# Patient Record
Sex: Female | Born: 2000 | Race: Black or African American | Hispanic: No | Marital: Single | Smoking: Never smoker
Health system: Southern US, Community
[De-identification: ages and names within clinical notes are randomized; demographics above are authoritative.]

## PROBLEM LIST (undated history)

## (undated) DIAGNOSIS — J45909 Unspecified asthma, uncomplicated: Secondary | ICD-10-CM

## (undated) HISTORY — PX: OTHER SURGICAL HISTORY: SHX169

---
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
## 2021-03-01 ENCOUNTER — Encounter: Payer: Self-pay | Admitting: Certified Nurse Midwife

## 2021-03-01 ENCOUNTER — Other Ambulatory Visit: Payer: Self-pay

## 2021-03-01 ENCOUNTER — Ambulatory Visit (INDEPENDENT_AMBULATORY_CARE_PROVIDER_SITE_OTHER): Payer: Medicaid Other | Admitting: Certified Nurse Midwife

## 2021-03-01 VITALS — BP 118/71 | HR 74 | Ht 65.0 in | Wt 149.5 lb

## 2021-03-01 DIAGNOSIS — Z30017 Encounter for initial prescription of implantable subdermal contraceptive: Secondary | ICD-10-CM

## 2021-03-01 NOTE — Progress Notes (Signed)
OB/GYN CONFERENCE NOTE:  Subjective:       Teresa Mcmillan is a 20 y.o. G0P0000 female who presents for a conference appointment. Current complaints include: concerns about her nexplanon. PT state she had a friend that had nexplanon that had reported issuse with her period after cessation and with getting pregnant.   Questions about breast exam-when to start and how to do them   Gynecologic History No LMP recorded (lmp unknown). Patient has had an implant. Contraception: Nexplanon  Obstetric History OB History  Gravida Para Term Preterm AB Living  0 0 0 0 0 0  SAB IAB Ectopic Multiple Live Births  0 0 0 0 0    Past Medical History:  Diagnosis Date   Asthma     Past Surgical History:  Procedure Laterality Date   bands     Mom reports constrictive bands on her legs and finger when she was 18 months.   club feet      Current Outpatient Medications on File Prior to Visit  Medication Sig Dispense Refill   albuterol (PROVENTIL) (5 MG/ML) 0.5% nebulizer solution Take 2.5 mg by nebulization every 6 (six) hours as needed for Wheezing.     brompheniramine-pseudoephedrine-DM 30-2-10 MG/5ML syrup Take 5 mLs by mouth 4 (four) times daily as needed. 120 mL 0   cetirizine HCl (ZYRTEC) 5 MG/5ML SOLN Take by mouth.     diphenhydrAMINE (BENADRYL) 25 MG tablet Take 1 tablet (25 mg total) by mouth every 6 (six) hours. 20 tablet 0   EPINEPHrine (EPIPEN JR) 0.15 MG/0.3ML injection Inject 0.15 mg into the muscle as needed for anaphylaxis.     etonogestrel (NEXPLANON) 68 MG IMPL implant 1 each by Subdermal route once.     ketoconazole (NIZORAL) 2 % shampoo APPLY TO THE AFFECTED AREAS LATHER LEAVE IN PLACE FOR 5 MINUTES, ...  (REFER TO PRESCRIPTION NOTES).  1   Multiple Vitamins-Minerals (MULTIVITAMIN) tablet Take 1 tablet by mouth daily. 100 tablet 0   trimethoprim-polymyxin b (POLYTRIM) ophthalmic solution Place 2 drops into the left eye every 4 (four) hours. 10 mL 0   No current  facility-administered medications on file prior to visit.    Allergies  Allergen Reactions   Amoxicillin Hives   Penicillins    Shellfish Allergy     Social History   Socioeconomic History   Marital status: Single    Spouse name: Not on file   Number of children: Not on file   Years of education: Not on file   Highest education level: Not on file  Occupational History   Not on file  Tobacco Use   Smoking status: Never   Smokeless tobacco: Never  Vaping Use   Vaping Use: Some days  Substance and Sexual Activity   Alcohol use: No   Drug use: No   Sexual activity: Not Currently    Birth control/protection: Implant  Other Topics Concern   Not on file  Social History Narrative   Patient lives at home with mom and granmother. She is in the 9th grade at [employer]. She does well in school. She enjoys math, volleyball and swimming   Social Determinants of Corporate investment banker Strain: Not on file  Food Insecurity: Not on file  Transportation Needs: Not on file  Physical Activity: Not on file  Stress: Not on file  Social Connections: Not on file  Intimate Partner Violence: Not on file    Family History  Problem Relation Age of Onset  Breast cancer Maternal Aunt    Cancer Maternal Grandfather    Hypertension Maternal Grandfather    Diabetes Paternal Grandmother    Migraines Mother    ADD / ADHD Mother    Hypertension Father    Kidney disease Father    Seizures Neg Hx    Autism Neg Hx    Anxiety disorder Neg Hx    Depression Neg Hx    Bipolar disorder Neg Hx    Schizophrenia Neg Hx     The following portions of the patient's history were reviewed and updated as appropriate: allergies, current medications, past family history, past medical history, past social history, past surgical history and problem list.  Review of Systems Pertinent items are noted in HPI.   Objective:   BP 118/71   Pulse 74   Ht 5\' 5"  (1.651 m)   Wt 149 lb 8 oz (67.8 kg)    LMP  (LMP Unknown)   BMI 24.88 kg/m    Assessment/Plan:   Patient Active Problem List   Diagnosis Date Noted   Vaginal discharge 12/07/2020   Tension headache 08/07/2017   Migraine without aura and without status migrainosus, not intractable 08/07/2017   Secondary amenorrhea 07/31/2017   Unspecified asthma(493.90) 11/12/2012       Time: 10  Discussed mode of action , hormone in nexplanon, duration of use, and resuming natural cycle once nexplanon removed. Reassurance given. All questions answered.   Reviewed self breat exam , encourage pt to start now, information sheet given . Return to Clinic: For annual or prn.    11/14/2012, CNM [facility]

## 2021-03-01 NOTE — Patient Instructions (Addendum)
Etonogestrel Implant What is this medication? ETONOGESTREL (et oh noe JES trel) prevents ovulation and pregnancy. It belongs to a group of medications called contraceptives. This medication is a progestin hormone. This medicine may be used for other purposes; ask your health care provider or pharmacist if you have questions. COMMON BRAND NAME(S): Implanon, Nexplanon What should I tell my care team before I take this medication? They need to know if you have any of these conditions: Abnormal vaginal bleeding Blood vessel disease or blood clots Breast, cervical, endometrial, ovarian, liver, or uterine cancer Diabetes Gallbladder disease Heart disease or recent heart attack High blood pressure High cholesterol or triglycerides Kidney disease Liver disease Migraine headaches Seizures Stroke Tobacco smoker An unusual or allergic reaction to etonogestrel, anesthetics or antiseptics, other medications, foods, dyes, or preservatives Pregnant or trying to get pregnant Breast-feeding How should I use this medication? This device is inserted just under the skin on the inner side of your upper arm by your care team. Talk to your care team about the use of this medication in children. Special care may be needed. Overdosage: If you think you have taken too much of this medicine contact a poison control center or emergency room at once. NOTE: This medicine is only for you. Do not share this medicine with others. What if I miss a dose? This does not apply. What may interact with this medication? Do not take this medication with any of the following: Amprenavir Fosamprenavir This medication may also interact with the following: Acitretin Aprepitant Armodafinil Bexarotene Bosentan Carbamazepine Certain medications for fungal infections like fluconazole, ketoconazole, itraconazole and voriconazole Certain medications to treat hepatitis, HIV or  AIDS Cyclosporine Felbamate Griseofulvin Lamotrigine Modafinil Oxcarbazepine Phenobarbital Phenytoin Primidone Rifabutin Rifampin Rifapentine St. John's wort Topiramate This list may not describe all possible interactions. Give your health care provider a list of all the medicines, herbs, non-prescription drugs, or dietary supplements you use. Also tell them if you smoke, drink alcohol, or use illegal drugs. Some items may interact with your medicine. What should I watch for while using this medication? This product does not protect you against HIV infection (AIDS) or other sexually transmitted diseases. You should be able to feel the implant by pressing your fingertips over the skin where it was inserted. Contact your care team if you cannot feel the implant, and use a non-hormonal birth control method (such as condoms) until your care team confirms that the implant is in place. Contact your care team if you think that the implant may have broken or become bent while in your arm. You will receive a user card from your care team after the implant is inserted. The card is a record of the location of the implant in your upper arm and when it should be removed. Keep this card with your health records. What side effects may I notice from receiving this medication? Side effects that you should report to your care team as soon as possible: Allergic reactions-skin rash, itching, hives, swelling of the face, lips, tongue, or throat Blood clot-pain, swelling, or warmth in the leg, shortness of breath, chest pain Gallbladder problems-severe stomach pain, nausea, vomiting, fever Increase in blood pressure Liver injury-right upper belly pain, loss of appetite, nausea, light-colored stool, dark yellow or brown urine, yellowing skin or eyes, unusual weakness or fatigue New or worsening migraines or headaches Pain, redness, or irritation at injection site Stroke-sudden numbness or weakness of the face,  arm, or leg, trouble speaking, confusion, trouble walking, loss   of balance or coordination, dizziness, severe headache, change in vision Unusual vaginal discharge, itching, or odor Worsening mood, feelings of depression Side effects that usually do not require medical attention (report to your care team if they continue or are bothersome): Breast pain or tenderness Dark patches of skin on the face or other sun-exposed areas Irregular menstrual cycles or spotting Nausea Weight gain This list may not describe all possible side effects. Call your doctor for medical advice about side effects. You may report side effects to FDA at 1-800-FDA-1088. Where should I keep my medication? This medication is given in a hospital or clinic and will not be stored at home. NOTE: This sheet is a summary. It may not cover all possible information. If you have questions about this medicine, talk to your doctor, pharmacist, or health care provider.  2022 Elsevier/Gold Standard (2020-06-22 09:15:27)

## 2021-04-02 ENCOUNTER — Emergency Department (HOSPITAL_COMMUNITY)
Admission: EM | Admit: 2021-04-02 | Discharge: 2021-04-03 | Disposition: A | Discharge status: Home / Self Care | Payer: Medicaid Other | Attending: Emergency Medicine | Admitting: Emergency Medicine

## 2021-04-02 ENCOUNTER — Other Ambulatory Visit: Payer: Self-pay

## 2021-04-02 ENCOUNTER — Encounter (HOSPITAL_COMMUNITY): Payer: Self-pay | Admitting: Emergency Medicine

## 2021-04-02 DIAGNOSIS — R319 Hematuria, unspecified: Secondary | ICD-10-CM | POA: Insufficient documentation

## 2021-04-02 DIAGNOSIS — J45909 Unspecified asthma, uncomplicated: Secondary | ICD-10-CM | POA: Insufficient documentation

## 2021-04-02 DIAGNOSIS — R109 Unspecified abdominal pain: Secondary | ICD-10-CM | POA: Diagnosis not present

## 2021-04-02 DIAGNOSIS — R300 Dysuria: Secondary | ICD-10-CM | POA: Insufficient documentation

## 2021-04-02 LAB — URINALYSIS, ROUTINE W REFLEX MICROSCOPIC
Bacteria, UA: NONE SEEN
Bilirubin Urine: NEGATIVE
Glucose, UA: NEGATIVE mg/dL
Ketones, ur: NEGATIVE mg/dL
Leukocytes,Ua: NEGATIVE
Nitrite: NEGATIVE
Protein, ur: NEGATIVE mg/dL
Specific Gravity, Urine: 1.002 — ABNORMAL LOW (ref 1.005–1.030)
pH: 6 (ref 5.0–8.0)

## 2021-04-02 LAB — POC URINE PREG, ED: Preg Test, Ur: NEGATIVE

## 2021-04-02 NOTE — ED Triage Notes (Signed)
Pt c/o burning with urination and blood in her urine. Symptoms started this morning.

## 2021-04-02 NOTE — ED Provider Notes (Signed)
Emergency Medicine Provider Triage Evaluation Note  Teresa Mcmillan , a 20 y.o. female  was evaluated in triage.  Pt complains of dysuria.  Onset this morning.  Reports associated hematuria.  Denies new or unusual vaginal discharge or bleeding.  Denies fevers or vomiting.    Review of Systems  Positive: Dysuria, hematuria Negative: Fever, chills  Physical Exam  BP 120/74 (BP Location: Right Arm)   Pulse 92   Temp 98.3 F (36.8 C) (Oral)   Resp 18   SpO2 100%  Gen:   Awake, no distress   Resp:  Normal effort  MSK:   Moves extremities without difficulty  Other:    Medical Decision Making  Medically screening exam initiated at 10:39 PM.  Appropriate orders placed.  Teresa Mcmillan was informed that the remainder of the evaluation will be completed by another provider, this initial triage assessment does not replace that evaluation, and the importance of remaining in the ED until their evaluation is complete.  Dysuria   Roxy Horseman, PA-C 04/02/21 2241    Eber Hong, MD 04/03/21 2231

## 2021-04-03 MED ORDER — PHENAZOPYRIDINE HCL 200 MG PO TABS: 200.0000 mg | ORAL_TABLET | Freq: Three times a day (TID) | ORAL | 0 refills | Status: DC

## 2021-04-03 NOTE — ED Provider Notes (Signed)
[facility] EMERGENCY DEPARTMENT Provider Note   CSN: [id number] Arrival date & time: 04/02/21  2229     History Chief Complaint  Patient presents with   Hematuria    Teresa Mcmillan is a 20 y.o. female who presents to the emergency department for further evaluation of hematuria and dysuria that began yesterday.  Hematuria is intermittent and is noticeable when she wipes after urinating.  Has never had symptoms like this in the past.  She rates her dysuria mild severity.  She also reports associated abdominal pain.  She denies any vaginal symptoms, fever, chills, nausea, vomiting, diarrhea.  No urinary frequency or urgency.  Patient is not clear that her period and takes Nexplanon for birth control.   Hematuria      Past Medical History:  Diagnosis Date   Asthma     Patient Active Problem List   Diagnosis Date Noted   Vaginal discharge 12/07/2020   Tension headache 08/07/2017   Migraine without aura and without status migrainosus, not intractable 08/07/2017   Secondary amenorrhea 07/31/2017   Unspecified asthma(493.90) 11/12/2012    Past Surgical History:  Procedure Laterality Date   bands     Mom reports constrictive bands on her legs and finger when she was 18 months.   club feet       OB History     Gravida  0   Para  0   Term  0   Preterm  0   AB  0   Living  0      SAB  0   IAB  0   Ectopic  0   Multiple  0   Live Births  0           Family History  Problem Relation Age of Onset   Breast cancer Maternal Aunt    Cancer Maternal Grandfather    Hypertension Maternal Grandfather    Diabetes Paternal Grandmother    Migraines Mother    ADD / ADHD Mother    Hypertension Father    Kidney disease Father    Seizures Neg Hx    Autism Neg Hx    Anxiety disorder Neg Hx    Depression Neg Hx    Bipolar disorder Neg Hx    Schizophrenia Neg Hx     Social History   Tobacco Use   Smoking status: Never   Smokeless  tobacco: Never  Vaping Use   Vaping Use: Some days  Substance Use Topics   Alcohol use: No   Drug use: No    Home Medications Prior to Admission medications   Medication Sig Start Date End Date Taking? Authorizing Provider  phenazopyridine (PYRIDIUM) 200 MG tablet Take 1 tablet (200 mg total) by mouth 3 (three) times daily. 04/03/21  Yes My Madariaga M, PA-C  albuterol (PROVENTIL) (5 MG/ML) 0.5% nebulizer solution Take 2.5 mg by nebulization every 6 (six) hours as needed for Wheezing.    [provider]  brompheniramine-pseudoephedrine-DM 30-2-10 MG/5ML syrup Take 5 mLs by mouth 4 (four) times daily as needed. 10/23/20   Evon Slack, PA-C  cetirizine HCl (ZYRTEC) 5 MG/5ML SOLN Take by mouth.    [provider]  diphenhydrAMINE (BENADRYL) 25 MG tablet Take 1 tablet (25 mg total) by mouth every 6 (six) hours. 10/22/15   Hess, Nada Boozer, PA-C  EPINEPHrine (EPIPEN JR) 0.15 MG/0.3ML injection Inject 0.15 mg into the muscle as needed for anaphylaxis.    [provider]  etonogestrel (NEXPLANON) 68 MG IMPL implant 1 each by Subdermal route once.    [provider]  ketoconazole (NIZORAL) 2 % shampoo APPLY TO THE AFFECTED AREAS LATHER LEAVE IN PLACE FOR 5 MINUTES, ...  (REFER TO PRESCRIPTION NOTES). 07/30/17   [provider]  Multiple Vitamins-Minerals (MULTIVITAMIN) tablet Take 1 tablet by mouth daily. 08/07/19   Caren Macadam, MD  trimethoprim-polymyxin b (POLYTRIM) ophthalmic solution Place 2 drops into the left eye every 4 (four) hours. 12/07/19   Mabe, Forbes Cellar, MD    Allergies    Amoxicillin, Penicillins, and Shellfish allergy  Review of Systems   Review of Systems  Genitourinary:  Positive for hematuria.  All other systems reviewed and are negative.  Physical Exam Updated Vital Signs BP 106/69 (BP Location: Left Arm)   Pulse 68   Temp 98.5 F (36.9 C) (Oral)   Resp 18   SpO2 100%   Physical Exam Vitals and nursing note  reviewed.  Constitutional:      General: She is not in acute distress.    Appearance: Normal appearance.  HENT:     Head: Normocephalic and atraumatic.  Eyes:     General:        Right eye: No discharge.        Left eye: No discharge.  Cardiovascular:     Comments: Regular rate and rhythm.  S1/S2 are distinct without any evidence of murmur, rubs, or gallops.  Radial pulses are 2+ bilaterally.  Dorsalis pedis pulses are 2+ bilaterally.  No evidence of pedal edema. Pulmonary:     Comments: Clear to auscultation bilaterally.  Normal effort.  No respiratory distress.  No evidence of wheezes, rales, or rhonchi heard throughout. Abdominal:     General: Abdomen is flat. Bowel sounds are normal. There is no distension.     Tenderness: There is no abdominal tenderness. There is no guarding or rebound.     Comments: No CVAT bilaterally.  Musculoskeletal:        General: Normal range of motion.     Cervical back: Neck supple.  Skin:    General: Skin is warm and dry.     Findings: No rash.  Neurological:     General: No focal deficit present.     Mental Status: She is alert.  Psychiatric:        Mood and Affect: Mood normal.        Behavior: Behavior normal.    ED Results / Procedures / Treatments   Labs (all labs ordered are listed, but only abnormal results are displayed) Labs Reviewed  URINALYSIS, ROUTINE W REFLEX MICROSCOPIC - Abnormal; Notable for the following components:      Result Value   Color, Urine COLORLESS (*)    Specific Gravity, Urine 1.002 (*)    Hgb urine dipstick SMALL (*)    All other components within normal limits  URINE CULTURE  POC URINE PREG, ED    EKG None  Radiology No results found.  Procedures Procedures   Medications Ordered in ED Medications - No data to display  ED Course  I have reviewed the triage vital signs and the nursing notes.  Pertinent labs & imaging results that were available during my care of the patient were reviewed by me  and considered in my medical decision making (see chart for details).    MDM Rules/Calculators/A&P  Teresa Mcmillan is a 20 y.o. female who presents the emergency department for for evaluation of dysuria and hematuria.  History and physical exam is somewhat concerning for urinary tract infection.  I have a low suspicion for pyelonephritis, kidney stone, or other emergent causes of her symptoms at this time.  Low suspicion for TOA, ectopic pregnancy, or PID.  Her urine did not reveal any signs of infection today. Small amount of hematuria.  I will send for culture and treat dysuria with Azo.  Patient was amenable to this plan.  All questions and concerns addressed.  She is safe for discharge.   Final Clinical Impression(s) / ED Diagnoses Final diagnoses:  Dysuria    Rx / DC Orders ED Discharge Orders          Ordered    phenazopyridine (PYRIDIUM) 200 MG tablet  3 times daily        04/03/21 0814             Hendricks Limes, PA-C 04/03/21 0818    Davonna Belling, MD 04/03/21 [phone]

## 2021-04-03 NOTE — Discharge Instructions (Signed)
Your urinalysis did not reveal any signs of infection today.  Did show a little bit of blood.  I will send for urine culture which we will call you with the results.  We will give you Pyridium for burning sensation.  Please take for the next 2 days.  Please return to the emergency part if you experience worsening pain, fevers, increased blood in your urine, or any other concerns you might have.

## 2021-04-04 ENCOUNTER — Telehealth: Payer: Self-pay | Admitting: Obstetrics and Gynecology

## 2021-04-04 LAB — URINE CULTURE: Culture: 1000 — AB

## 2021-04-04 NOTE — Telephone Encounter (Signed)
Pt called stating that she has been having blood in her urine since Saturday morning and has a burning stinging pain when she urinates. Pt was see at ER where they told her no signs of infection and RX AZO. Pt is wanting to be seen for further explanation and evaluation. I provided soonest openings Pt was unable to come 11-08 due to school. Please Advice.

## 2021-04-05 ENCOUNTER — Encounter: Payer: Medicaid Other | Admitting: Certified Nurse Midwife

## 2021-04-05 ENCOUNTER — Telehealth: Payer: Self-pay

## 2021-04-05 ENCOUNTER — Telehealth (HOSPITAL_BASED_OUTPATIENT_CLINIC_OR_DEPARTMENT_OTHER): Payer: Self-pay | Admitting: Emergency Medicine

## 2021-04-05 NOTE — Telephone Encounter (Signed)
Post ED Visit - Positive Culture Follow-up  Culture report reviewed by antimicrobial stewardship pharmacist: [facility] []  Nathan Batchelder, Pharm.D. []  , Pharm.D., BCPS AQ-ID []  , Pharm.D., BCPS []  Celedonio Miyamoto, Pharm.D., BCPS []  [place], Garvin Fila.D., BCPS, AAHIVP []  , Pharm.D., BCPS, AAHIVP []  Georgina Pillion, PharmD, BCPS []  , PharmD, BCPS []  [place], PharmD, BCPS [x]  [address], PharmD []  , PharmD, BCPS []  Estella Husk, PharmD  Pharmacy Team []  Lysle Pearl, PharmD []  , PharmD []  Phillips Climes, PharmD []  , Rph []  Agapito Games) , PharmD []  Conni Elliot, PharmD []  , PharmD []  Mervyn Gay, PharmD []  , PharmD []  Vinnie Level, PharmD []  [facility], PharmD []  , PharmD []  Len Childs, PharmD   Positive urine culture No further patient follow-up is required at this time.  Katti Pelle 04/05/2021, 10:22 AM

## 2021-04-05 NOTE — Telephone Encounter (Signed)
Pt called in complaining of urinary symptoms and burning. Pt went to ED and had urine culture done but it did not show for enough bacteria for antibiotic to be sent in. Per pt., ED Rxed prescription Azo and patient still has 2 doses left. Patient & pts mother want to be seen asap and I explained to pt that she had an appt with [facility] today and canceled. Explained to pt and mother that we cannot send in medication if culture did not grow. Pt requested to schedule a future appt with front desk. All questions answered.

## 2021-04-05 NOTE — Telephone Encounter (Signed)
LM for pt to call back.

## 2021-04-25 ENCOUNTER — Encounter: Payer: Medicaid Other | Admitting: Certified Nurse Midwife

## 2021-04-28 ENCOUNTER — Encounter: Payer: Self-pay | Admitting: Certified Nurse Midwife

## 2021-05-03 ENCOUNTER — Encounter: Payer: Medicaid Other | Admitting: Certified Nurse Midwife

## 2021-08-03 IMAGING — CR DG CHEST 2V
2 series · 2 of 2 positions shown · non-contrast
Comparison: 07/15/2018

CLINICAL DATA: Mid chest pain all day

EXAM:
CHEST - 2 VIEW

[chest pa]
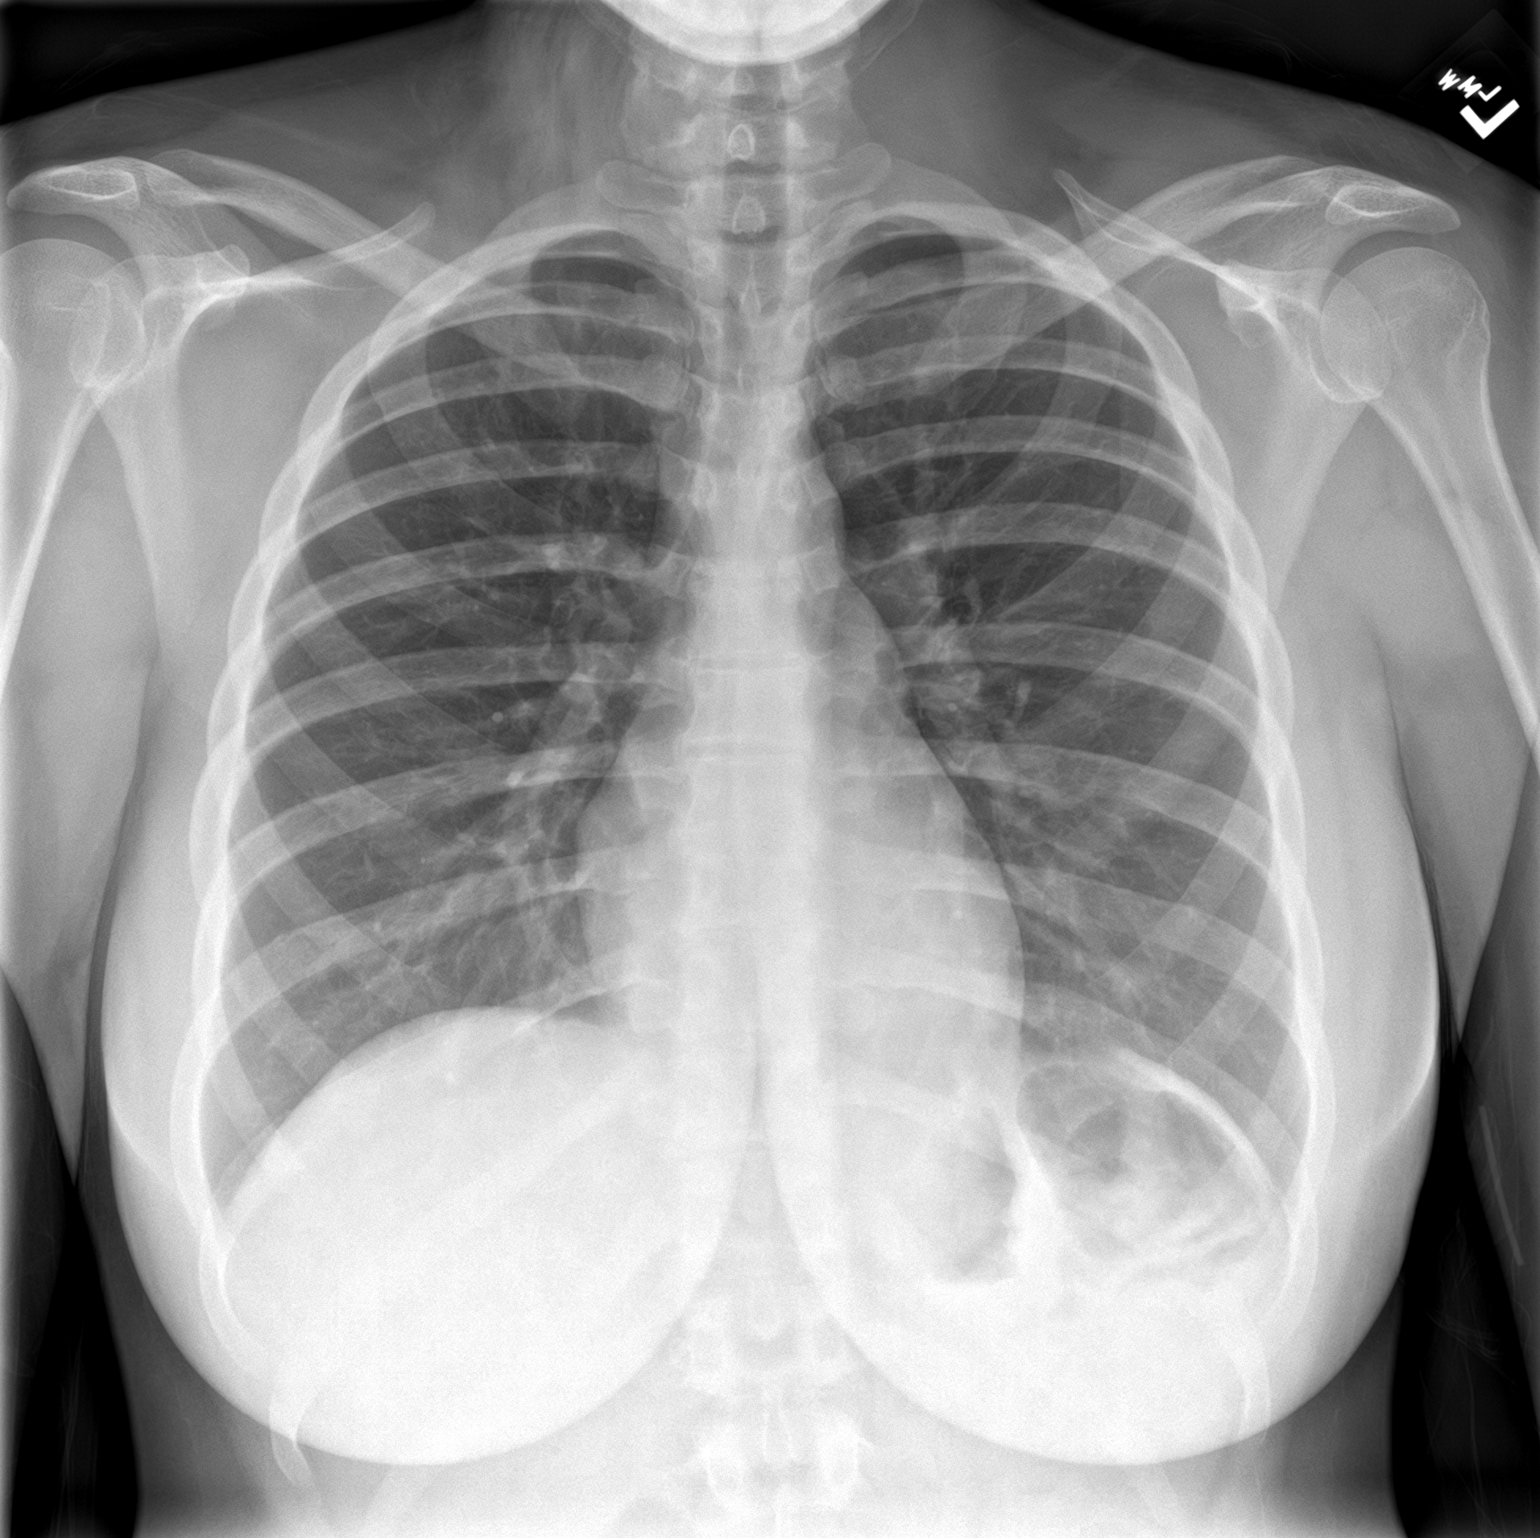

[chest lat]
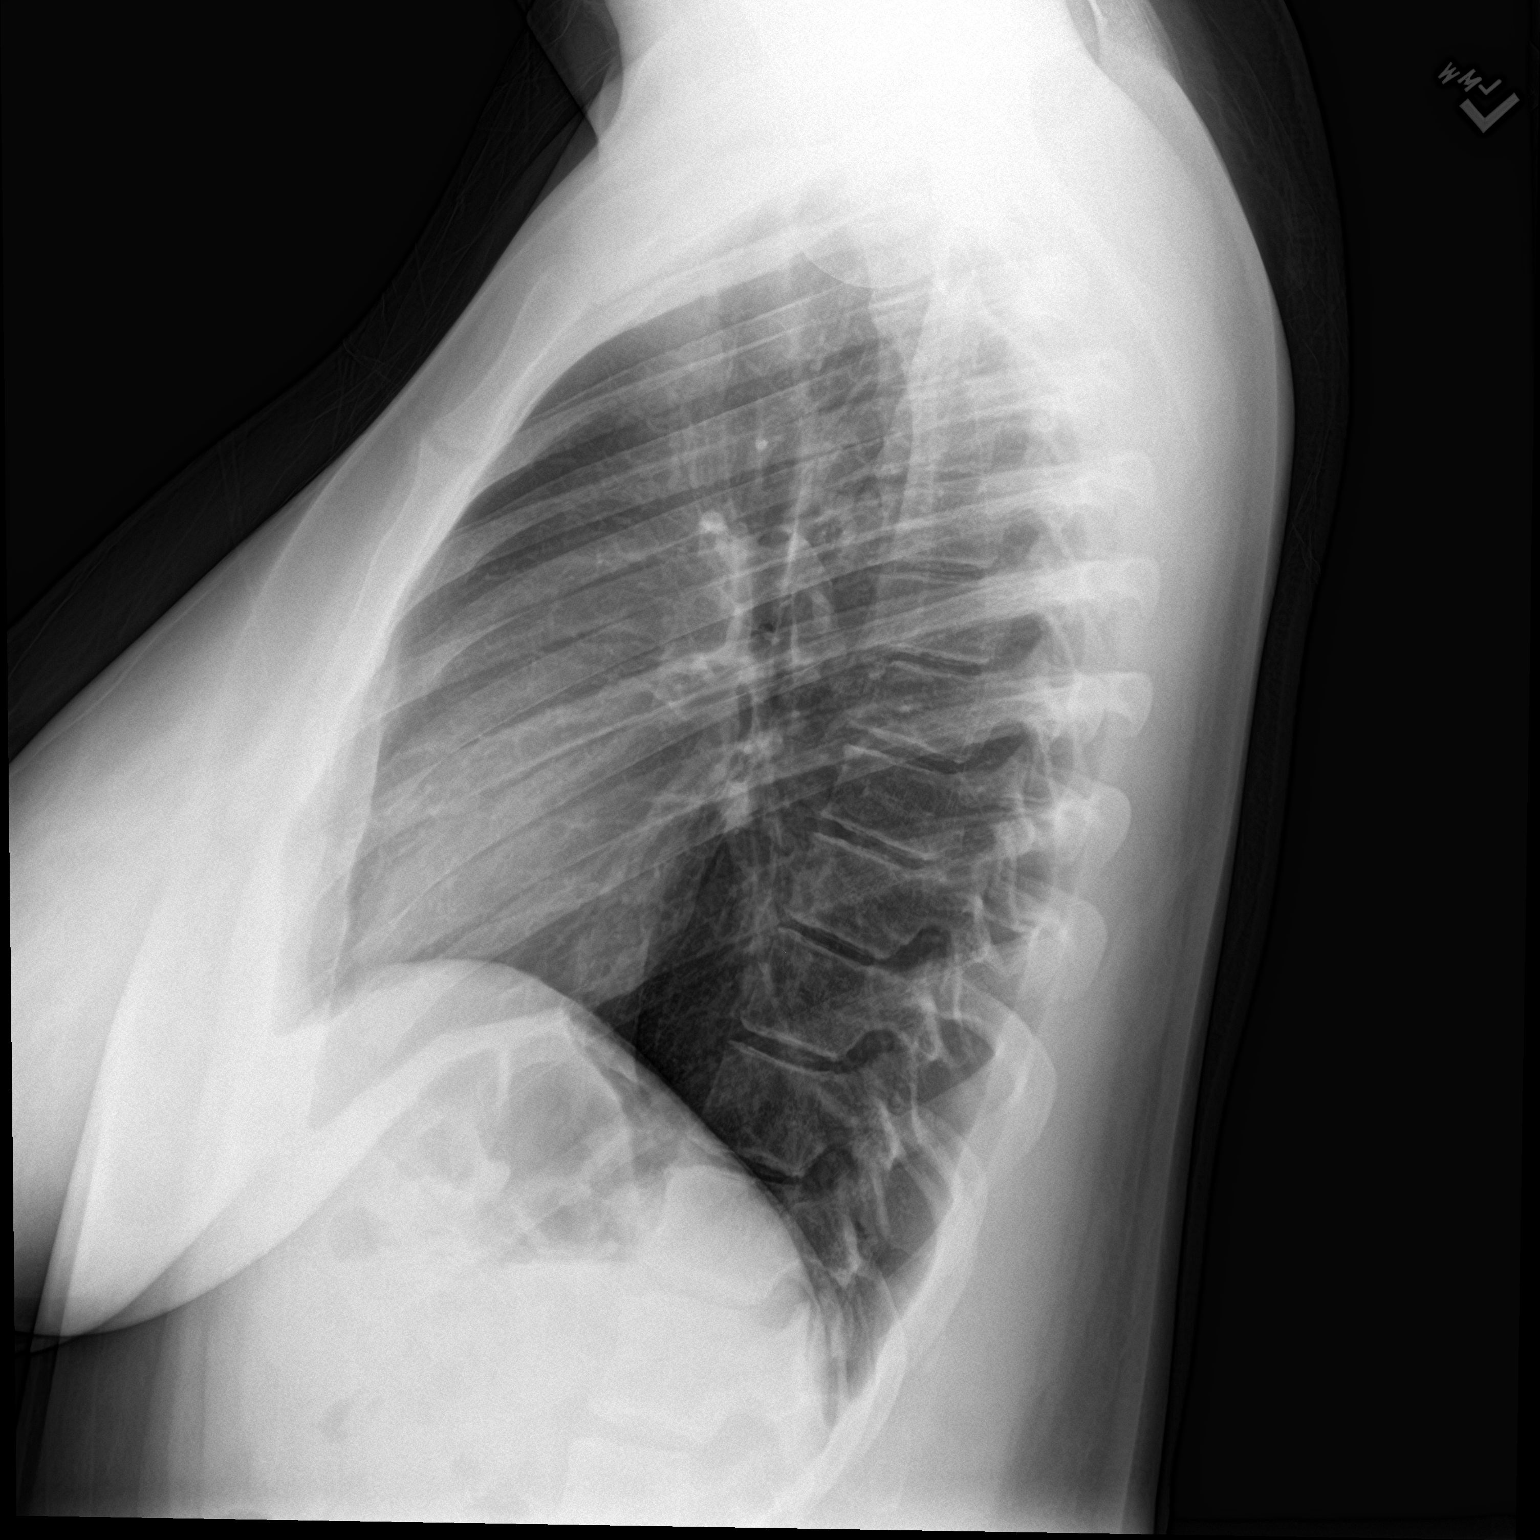

[2 of 2 positions shown; findings below may reference images not displayed]

FINDINGS: The heart size and mediastinal contours are within normal limits.
Both lungs are clear. The visualized skeletal structures are
unremarkable.
IMPRESSION: No active cardiopulmonary disease.

## 2021-09-01 ENCOUNTER — Encounter: Payer: Medicaid Other | Admitting: Obstetrics

## 2021-09-05 ENCOUNTER — Ambulatory Visit: Payer: Medicaid Other | Admitting: Certified Nurse Midwife

## 2021-09-05 ENCOUNTER — Ambulatory Visit (INDEPENDENT_AMBULATORY_CARE_PROVIDER_SITE_OTHER): Payer: Medicaid Other | Admitting: Certified Nurse Midwife

## 2021-09-05 VITALS — BP 121/72 | HR 93 | Ht 64.0 in | Wt 146.6 lb

## 2021-09-05 DIAGNOSIS — R300 Dysuria: Secondary | ICD-10-CM

## 2021-09-05 DIAGNOSIS — N9089 Other specified noninflammatory disorders of vulva and perineum: Secondary | ICD-10-CM

## 2021-09-05 DIAGNOSIS — R399 Unspecified symptoms and signs involving the genitourinary system: Secondary | ICD-10-CM

## 2021-09-05 LAB — POCT URINALYSIS DIPSTICK
Bilirubin, UA: NEGATIVE
Blood, UA: NEGATIVE
Glucose, UA: NEGATIVE
Ketones, UA: NEGATIVE
Leukocytes, UA: NEGATIVE
Nitrite, UA: NEGATIVE
Protein, UA: POSITIVE — AB
Spec Grav, UA: 1.015 (ref 1.010–1.025)
Urobilinogen, UA: 0.2 E.U./dL
pH, UA: 6 (ref 5.0–8.0)

## 2021-09-05 NOTE — Progress Notes (Signed)
GYN ENCOUNTER NOTE ? ?Subjective:  ?    ? Teresa Mcmillan is a 21 y.o. G0P0000 female is here for gynecologic evaluation of the following issues:  ?1. Pt state she has noticed a bump on the upper part of her mons that is not painful. She denies possible sti, she uses condoms and has not had oral intercourse.  ? ?2. Burning with voiding after shower.   ?Gynecologic History ?No LMP recorded (lmp unknown). Patient has had an implant. ?Contraception: condoms ?Last Pap: n/a. ?Last mammogram: n/a  ? ?Obstetric History ?OB History  ?Gravida Para Term Preterm AB Living  ?0 0 0 0 0 0  ?SAB IAB Ectopic Multiple Live Births  ?0 0 0 0 0  ? ? ?Past Medical History:  ?Diagnosis Date  ? Asthma   ? ? ?Past Surgical History:  ?Procedure Laterality Date  ? bands    ? Mom reports constrictive bands on her legs and finger when she was 18 months.  ? club feet    ? ? ?Current Outpatient Medications on File Prior to Visit  ?Medication Sig Dispense Refill  ? albuterol (PROVENTIL) (5 MG/ML) 0.5% nebulizer solution Take 2.5 mg by nebulization every 6 (six) hours as needed for Wheezing.    ? brompheniramine-pseudoephedrine-DM 30-2-10 MG/5ML syrup Take 5 mLs by mouth 4 (four) times daily as needed. 120 mL 0  ? cetirizine HCl (ZYRTEC) 5 MG/5ML SOLN Take by mouth.    ? diphenhydrAMINE (BENADRYL) 25 MG tablet Take 1 tablet (25 mg total) by mouth every 6 (six) hours. 20 tablet 0  ? EPINEPHrine (EPIPEN JR) 0.15 MG/0.3ML injection Inject 0.15 mg into the muscle as needed for anaphylaxis.    ? etonogestrel (NEXPLANON) 68 MG IMPL implant 1 each by Subdermal route once.    ? ketoconazole (NIZORAL) 2 % shampoo APPLY TO THE AFFECTED AREAS LATHER LEAVE IN PLACE FOR 5 MINUTES, ...  (REFER TO PRESCRIPTION NOTES).  1  ? Multiple Vitamins-Minerals (MULTIVITAMIN) tablet Take 1 tablet by mouth daily. 100 tablet 0  ? phenazopyridine (PYRIDIUM) 200 MG tablet Take 1 tablet (200 mg total) by mouth 3 (three) times daily. 6 tablet 0  ? trimethoprim-polymyxin b  (POLYTRIM) ophthalmic solution Place 2 drops into the left eye every 4 (four) hours. 10 mL 0  ? ?No current facility-administered medications on file prior to visit.  ? ? ?Allergies  ?Allergen Reactions  ? Amoxicillin Hives  ? Penicillins   ? Shellfish Allergy   ? ? ?Social History  ? ?Socioeconomic History  ? Marital status: Single  ?  Spouse name: Not on file  ? Number of children: Not on file  ? Years of education: Not on file  ? Highest education level: Not on file  ?Occupational History  ? Not on file  ?Tobacco Use  ? Smoking status: Never  ? Smokeless tobacco: Never  ?Vaping Use  ? Vaping Use: Some days  ?Substance and Sexual Activity  ? Alcohol use: No  ? Drug use: No  ? Sexual activity: Not Currently  ?  Birth control/protection: Implant  ?Other Topics Concern  ? Not on file  ?Social History Narrative  ? Patient lives at home with mom and granmother. She is in the 9th grade at [employer]. She does well in school. She enjoys math, volleyball and swimming  ? ?Social Determinants of Health  ? ?Financial Resource Strain: Not on file  ?Food Insecurity: Not on file  ?Transportation Needs: Not on file  ?Physical Activity: Not on  file  ?Stress: Not on file  ?Social Connections: Not on file  ?Intimate Partner Violence: Not on file  ? ? ?Family History  ?Problem Relation Age of Onset  ? Breast cancer Maternal Aunt   ? Cancer Maternal Grandfather   ? Hypertension Maternal Grandfather   ? Diabetes Paternal Grandmother   ? Migraines Mother   ? ADD / ADHD Mother   ? Hypertension Father   ? Kidney disease Father   ? Seizures Neg Hx   ? Autism Neg Hx   ? Anxiety disorder Neg Hx   ? Depression Neg Hx   ? Bipolar disorder Neg Hx   ? Schizophrenia Neg Hx   ? ? ?The following portions of the patient's history were reviewed and updated as appropriate: allergies, current medications, past family history, past medical history, past social history, past surgical history and problem list. ? ?Review of Systems ?Review of Systems -  Negative except as mentioned in HPI ?Review of Systems - General ROS: negative for - chills, fatigue, fever, hot flashes, malaise or night sweats ?Hematological and Lymphatic ROS: negative for - bleeding problems or swollen lymph nodes ?Gastrointestinal ROS: negative for - abdominal pain, blood in stools, change in bowel habits and nausea/vomiting ?Musculoskeletal ROS: negative for - joint pain, muscle pain or muscular weakness ?Genito-Urinary ROS: negative for - change in menstrual cycle, dysmenorrhea, dyspareunia, dysuria, genital discharge, genital ulcers, hematuria, incontinence, irregular/heavy menses, nocturia or pelvic pain. Bum on MONs.  ? ?Objective:  ? ?BP 121/72   Pulse 93   Ht 5\' 4"  (1.626 m)   Wt 146 lb 9.6 oz (66.5 kg)   LMP  (LMP Unknown)   BMI 25.16 kg/m?  ?CONSTITUTIONAL: Well-developed, well-nourished female in no acute distress.  ?HENT:  Normocephalic, atraumatic.  ?NECK: Normal range of motion, supple, no masses.  Normal thyroid.  ?SKIN: Skin is warm and dry. No rash noted. Not diaphoretic. No erythema. No pallor. ?NEUROLGIC: Alert and oriented to person, place, and time. PSYCHIATRIC: Normal mood and affect. Normal behavior. Normal judgment and thought content. ?CARDIOVASCULAR:Not Examined ?RESPIRATORY: Not Examined ?BREASTS: Not Examined ?ABDOMEN: Soft, non distended; Non tender.  No Organomegaly. ?PELVIC: ? External Genitalia: Normal, no lession seen . Pt points out her clitoris. It is not abnormal in appearance.  ? BUS: Normal ? Vagina: Normal ?MUSCULOSKELETAL: Normal range of motion. No tenderness.  No cyanosis, clubbing, or edema. ? ? ?Assessment:  ? ?Normal female exam   ? ? ?Plan:  ? ?Discussed with pt that this is her clitoris., she states that it felt bigger than it had before. Discussed hormonal changes and stimulation can cause it to encourage. Urine dip to evaluate for UTI. Discussed being less agressive with washing in shower and using gentle soap with out dye and perfume. She  verbalizes and agrees to plan. Follow up prn.  ? ? , CNM  ?

## 2021-09-08 ENCOUNTER — Telehealth: Payer: Self-pay | Admitting: Certified Nurse Midwife

## 2021-09-08 NOTE — Telephone Encounter (Signed)
Pt called and stated she would like a call for clarification on test results from 09/05/2021. Please advise.  ?

## 2021-09-08 NOTE — Telephone Encounter (Signed)
Pt is concerned over her labs. Pt states My Chart flagged a result. Please return her call.  ?

## 2021-09-09 NOTE — Telephone Encounter (Signed)
Spoke with pts mother in regards to 'flagged' results. Explained to patient's mother that urine results were WNL and that urinalysis was not indicative of UTI based on lack of leuks, nits, etc. Pts mother verbalized understanding of results.  ?

## 2021-11-14 ENCOUNTER — Telehealth: Payer: Self-pay | Admitting: Certified Nurse Midwife

## 2021-11-14 NOTE — Telephone Encounter (Signed)
PT called stating " she was experiencing Tender breast " Per protocol informed she can take 800 mg Ibuprofen every 8 hr  - wear a Supportive bra, Vitamin E 800iu for 7 days. If no improvement within 48 hrs call the office to schedule an appointment. Verbalized understanding

## 2021-11-14 NOTE — Telephone Encounter (Signed)
Made in error

## 2022-01-12 ENCOUNTER — Ambulatory Visit: Payer: Medicaid Other

## 2022-02-07 ENCOUNTER — Encounter: Payer: Medicaid Other | Admitting: Certified Nurse Midwife

## 2022-02-21 ENCOUNTER — Encounter: Payer: Self-pay | Admitting: Certified Nurse Midwife

## 2022-02-21 ENCOUNTER — Other Ambulatory Visit (HOSPITAL_COMMUNITY)
Admission: RE | Admit: 2022-02-21 | Discharge: 2022-02-21 | Disposition: A | Discharge status: Home / Self Care | Payer: Medicaid Other | Source: Ambulatory Visit | Attending: Certified Nurse Midwife | Admitting: Certified Nurse Midwife

## 2022-02-21 ENCOUNTER — Ambulatory Visit (INDEPENDENT_AMBULATORY_CARE_PROVIDER_SITE_OTHER): Payer: Medicaid Other | Admitting: Certified Nurse Midwife

## 2022-02-21 VITALS — BP 121/80 | HR 80 | Ht 65.0 in | Wt 151.5 lb

## 2022-02-21 DIAGNOSIS — N898 Other specified noninflammatory disorders of vagina: Secondary | ICD-10-CM | POA: Diagnosis not present

## 2022-02-21 NOTE — Progress Notes (Signed)
GYN ENCOUNTER NOTE  Subjective:       Teresa Mcmillan is a 21 y.o. G0P0000 female is here for gynecologic evaluation of the following issues:  1. Vaginal discharge 2. Breast tenderness .     Gynecologic History No LMP recorded. Patient has had an implant. Contraception: condoms Last Pap: n/a  Last mammogram: n/a .   Obstetric History OB History  Gravida Para Term Preterm AB Living  0 0 0 0 0 0  SAB IAB Ectopic Multiple Live Births  0 0 0 0 0    Past Medical History:  Diagnosis Date   Asthma     Past Surgical History:  Procedure Laterality Date   bands     Mom reports constrictive bands on her legs and finger when she was 18 months.   club feet      Current Outpatient Medications on File Prior to Visit  Medication Sig Dispense Refill   albuterol (PROVENTIL) (5 MG/ML) 0.5% nebulizer solution Take 2.5 mg by nebulization every 6 (six) hours as needed for Wheezing.     brompheniramine-pseudoephedrine-DM 30-2-10 MG/5ML syrup Take 5 mLs by mouth 4 (four) times daily as needed. 120 mL 0   cetirizine HCl (ZYRTEC) 5 MG/5ML SOLN Take by mouth.     diphenhydrAMINE (BENADRYL) 25 MG tablet Take 1 tablet (25 mg total) by mouth every 6 (six) hours. 20 tablet 0   EPINEPHrine (EPIPEN JR) 0.15 MG/0.3ML injection Inject 0.15 mg into the muscle as needed for anaphylaxis.     etonogestrel (NEXPLANON) 68 MG IMPL implant 1 each by Subdermal route once.     ketoconazole (NIZORAL) 2 % shampoo APPLY TO THE AFFECTED AREAS LATHER LEAVE IN PLACE FOR 5 MINUTES, ...  (REFER TO PRESCRIPTION NOTES).  1   Multiple Vitamins-Minerals (MULTIVITAMIN) tablet Take 1 tablet by mouth daily. 100 tablet 0   phenazopyridine (PYRIDIUM) 200 MG tablet Take 1 tablet (200 mg total) by mouth 3 (three) times daily. 6 tablet 0   trimethoprim-polymyxin b (POLYTRIM) ophthalmic solution Place 2 drops into the left eye every 4 (four) hours. 10 mL 0   No current facility-administered medications on file prior to visit.     Allergies  Allergen Reactions   Amoxicillin Hives   Penicillins    Shellfish Allergy     Social History   Socioeconomic History   Marital status: Single    Spouse name: Not on file   Number of children: Not on file   Years of education: Not on file   Highest education level: Not on file  Occupational History   Not on file  Tobacco Use   Smoking status: Never   Smokeless tobacco: Never  Vaping Use   Vaping Use: Some days  Substance and Sexual Activity   Alcohol use: No   Drug use: No   Sexual activity: Not Currently    Birth control/protection: Implant  Other Topics Concern   Not on file  Social History Narrative   Patient lives at home with mom and granmother. She is in the 9th grade at [employer]. She does well in school. She enjoys math, volleyball and swimming   Social Determinants of Corporate investment banker Strain: Not on file  Food Insecurity: Not on file  Transportation Needs: Not on file  Physical Activity: Not on file  Stress: Not on file  Social Connections: Not on file  Intimate Partner Violence: Not on file    Family History  Problem Relation Age of Onset  Breast cancer Maternal Aunt    Cancer Maternal Grandfather    Hypertension Maternal Grandfather    Diabetes Paternal 15    Migraines Mother    ADD / ADHD Mother    Hypertension Father    Kidney disease Father    Seizures Neg Hx    Autism Neg Hx    Anxiety disorder Neg Hx    Depression Neg Hx    Bipolar disorder Neg Hx    Schizophrenia Neg Hx     The following portions of the patient's history were reviewed and updated as appropriate: allergies, current medications, past family history, past medical history, past social history, past surgical history and problem list.  Review of Systems Review of Systems - Negative except as mentioned in HPI Review of Systems - General ROS: negative for - chills, fatigue, fever, hot flashes, malaise or night sweats Hematological and  Lymphatic ROS: negative for - bleeding problems or swollen lymph nodes Gastrointestinal ROS: negative for - abdominal pain, blood in stools, change in bowel habits and nausea/vomiting Musculoskeletal ROS: negative for - joint pain, muscle pain or muscular weakness Breast : right breast upper outer quadrant tender.  Genito-Urinary ROS: negative for - change in menstrual cycle, dysmenorrhea, dyspareunia, dysuria, genital discharge, genital ulcers, hematuria, incontinence, irregular/heavy menses, nocturia or pelvic pain   Objective:   BP 121/80   Pulse 80   Ht 5\' 5"  (1.651 m)   Wt 151 lb 8 oz (68.7 kg)   BMI 25.21 kg/m  CONSTITUTIONAL: Well-developed, well-nourished female in no acute distress.  HENT:  Normocephalic, atraumatic.  NECK: Normal range of motion, supple, no masses.  Normal thyroid.  SKIN: Skin is warm and dry. No rash noted. Not diaphoretic. No erythema. No pallor. [place]: Alert and oriented to person, place, and time. PSYCHIATRIC: Normal mood and affect. Normal behavior. Normal judgment and thought content. CARDIOVASCULAR:Not Examined RESPIRATORY: Not Examined BREASTS: Breasts: breasts appear normal, no suspicious masses, no skin or nipple changes or axillary nodes. Right breast fibrocystic dense tissue   ABDOMEN: Soft, non distended; Non tender.  No Organomegaly. PELVIC:  External Genitalia: Normal  BUS: Normal  Vagina: Normal  Cervix: Normal, slight brown/green color, swab collected  MUSCULOSKELETAL: Normal range of motion. No tenderness.  No cyanosis, clubbing, or edema.   Assessment:   Vaginal discharge  Breast pain   Plan:    Swab collected , will follow up with results. Discussed self help measures for breast pain due to fibrocystic breast tissue.  She verbalizes and agrees to plan of care.   Philip Aspen, CNM

## 2022-02-21 NOTE — Patient Instructions (Signed)

## 2022-02-23 LAB — CERVICOVAGINAL ANCILLARY ONLY
Bacterial Vaginitis (gardnerella): NEGATIVE
Candida Glabrata: NEGATIVE
Candida Vaginitis: NEGATIVE
Chlamydia: NEGATIVE
Comment: NEGATIVE
Comment: NEGATIVE
Comment: NEGATIVE
Comment: NEGATIVE
Comment: NEGATIVE
Comment: NORMAL
Neisseria Gonorrhea: NEGATIVE
Trichomonas: NEGATIVE

## 2022-04-17 ENCOUNTER — Ambulatory Visit (INDEPENDENT_AMBULATORY_CARE_PROVIDER_SITE_OTHER): Payer: Medicaid Other | Admitting: Certified Nurse Midwife

## 2022-04-17 ENCOUNTER — Encounter: Payer: Self-pay | Admitting: Certified Nurse Midwife

## 2022-04-17 VITALS — BP 105/70 | HR 88 | Wt 154.0 lb

## 2022-04-17 DIAGNOSIS — L689 Hypertrichosis, unspecified: Secondary | ICD-10-CM | POA: Diagnosis not present

## 2022-04-17 MED ORDER — IMIQUIMOD 3.75 % EX CREA: 1.0000 | TOPICAL_CREAM | Freq: Every day | CUTANEOUS | 0 refills | Status: DC

## 2022-04-17 NOTE — Progress Notes (Signed)
GYN ENCOUNTER NOTE  Subjective:       ROSSELLA Mcmillan is a 21 y.o. G0P0000 female is here for gynecologic evaluation of the following issues:  Hair growth on her chin Vaginal bumps .     Gynecologic History No LMP recorded (within years). Patient has had an implant. Contraception: Nexplanon Last Pap: n/a.  Last mammogram: n/a   Obstetric History OB History  Gravida Para Term Preterm AB Living  0 0 0 0 0 0  SAB IAB Ectopic Multiple Live Births  0 0 0 0 0    Past Medical History:  Diagnosis Date   Asthma     Past Surgical History:  Procedure Laterality Date   bands     Mom reports constrictive bands on her legs and finger when she was 18 months.   club feet      Current Outpatient Medications on File Prior to Visit  Medication Sig Dispense Refill   albuterol (PROVENTIL) (5 MG/ML) 0.5% nebulizer solution Take 2.5 mg by nebulization every 6 (six) hours as needed for Wheezing.     brompheniramine-pseudoephedrine-DM 30-2-10 MG/5ML syrup Take 5 mLs by mouth 4 (four) times daily as needed. 120 mL 0   cetirizine HCl (ZYRTEC) 5 MG/5ML SOLN Take by mouth.     diphenhydrAMINE (BENADRYL) 25 MG tablet Take 1 tablet (25 mg total) by mouth every 6 (six) hours. 20 tablet 0   EPINEPHrine (EPIPEN JR) 0.15 MG/0.3ML injection Inject 0.15 mg into the muscle as needed for anaphylaxis.     etonogestrel (NEXPLANON) 68 MG IMPL implant 1 each by Subdermal route once.     ketoconazole (NIZORAL) 2 % shampoo APPLY TO THE AFFECTED AREAS LATHER LEAVE IN PLACE FOR 5 MINUTES, ...  (REFER TO PRESCRIPTION NOTES).  1   Multiple Vitamins-Minerals (MULTIVITAMIN) tablet Take 1 tablet by mouth daily. 100 tablet 0   phenazopyridine (PYRIDIUM) 200 MG tablet Take 1 tablet (200 mg total) by mouth 3 (three) times daily. 6 tablet 0   trimethoprim-polymyxin b (POLYTRIM) ophthalmic solution Place 2 drops into the left eye every 4 (four) hours. (Patient not taking: Reported on 04/17/2022) 10 mL 0   No current  facility-administered medications on file prior to visit.    Allergies  Allergen Reactions   Amoxicillin Hives   Penicillins    Shellfish Allergy     Social History   Socioeconomic History   Marital status: Single    Spouse name: Not on file   Number of children: Not on file   Years of education: Not on file   Highest education level: Not on file  Occupational History   Not on file  Tobacco Use   Smoking status: Never   Smokeless tobacco: Never  Vaping Use   Vaping Use: Some days  Substance and Sexual Activity   Alcohol use: No   Drug use: No   Sexual activity: Not Currently    Birth control/protection: Implant  Other Topics Concern   Not on file  Social History Narrative   Patient lives at home with mom and granmother. She is in the 9th grade at [employer]. She does well in school. She enjoys math, volleyball and swimming   Social Determinants of Radio broadcast assistant Strain: Not on file  Food Insecurity: Not on file  Transportation Needs: Not on file  Physical Activity: Not on file  Stress: Not on file  Social Connections: Not on file  Intimate Partner Violence: Not on file    Family  History  Problem Relation Age of Onset   Breast cancer Maternal Aunt    Cancer Maternal Grandfather    Hypertension Maternal Grandfather    Diabetes Paternal Grandmother    Migraines Mother    ADD / ADHD Mother    Hypertension Father    Kidney disease Father    Seizures Neg Hx    Autism Neg Hx    Anxiety disorder Neg Hx    Depression Neg Hx    Bipolar disorder Neg Hx    Schizophrenia Neg Hx     The following portions of the patient's history were reviewed and updated as appropriate: allergies, current medications, past family history, past medical history, past social history, past surgical history and problem list.  Review of Systems Review of Systems - Negative except as mentioned in HPI Review of Systems - General ROS: negative for - chills, fatigue, fever,  hot flashes, malaise or night sweats Hematological and Lymphatic ROS: negative for - bleeding problems or swollen lymph nodes Gastrointestinal ROS: negative for - abdominal pain, blood in stools, change in bowel habits and nausea/vomiting Musculoskeletal ROS: negative for - joint pain, muscle pain or muscular weakness Genito-Urinary ROS: negative for - change in menstrual cycle, dysmenorrhea, dyspareunia, dysuria, genital discharge, genital ulcers, hematuria, incontinence, irregular/heavy menses, nocturia or pelvic pain. Vaginal lesion , Objective:   BP 105/70   Pulse 88   Wt 154 lb (69.9 kg)   LMP  (Within Years)   BMI 25.63 kg/m  CONSTITUTIONAL: Well-developed, well-nourished female in no acute distress.  HENT:  Normocephalic, atraumatic.  NECK: Normal range of motion, supple, no masses.  Normal thyroid.  SKIN: Skin is warm and dry. No rash noted. Not diaphoretic. No erythema. No pallor. NEUROLGIC: Alert and oriented to person, place, and time. PSYCHIATRIC: Normal mood and affect. Normal behavior. Normal judgment and thought content. CARDIOVASCULAR:Not Examined RESPIRATORY: Not Examined BREASTS: Not Examined ABDOMEN: Soft, non distended; Non tender.  No Organomegaly. PELVIC:  External Genitalia: Normal  BUS: Normal  Vagina: Normal, condylomata present at vaginal opening.    MUSCULOSKELETAL: Normal range of motion. No tenderness.  No cyanosis, clubbing, or edema.     Assessment:   Condylomata  Excess hair growth   Plan:   Discussed biopsy vs treatment of suspected HPV. Pt request to treat . Orders placed for imaquid. She declines vaginal swab stating she had done recently with PCP . Labs for testosterone and TSH ordered. Will follow up with results.

## 2022-04-21 LAB — THYROID PANEL WITH TSH
Free Thyroxine Index: 1.4 (ref 1.2–4.9)
T3 Uptake Ratio: 29 % (ref 24–39)
T4, Total: 4.7 ug/dL (ref 4.5–12.0)
TSH: 0.716 u[IU]/mL (ref 0.450–4.500)

## 2022-04-21 LAB — TESTOSTERONE, FREE, TOTAL, SHBG
Sex Hormone Binding: 51.5 nmol/L (ref 24.6–122.0)
Testosterone, Free: 1 pg/mL
Testosterone: 14 ng/dL (ref 13–71)

## 2022-04-24 ENCOUNTER — Encounter: Payer: Self-pay | Admitting: Certified Nurse Midwife

## 2022-04-24 ENCOUNTER — Other Ambulatory Visit: Payer: Self-pay | Admitting: Certified Nurse Midwife

## 2022-04-24 ENCOUNTER — Other Ambulatory Visit: Payer: Self-pay

## 2022-04-24 MED ORDER — IMIQUIMOD 2.5 % EX CREA: 1.0000 | TOPICAL_CREAM | Freq: Every evening | CUTANEOUS | 1 refills | Status: DC

## 2022-04-25 ENCOUNTER — Telehealth: Payer: Self-pay

## 2022-04-25 DIAGNOSIS — B079 Viral wart, unspecified: Secondary | ICD-10-CM

## 2022-04-25 MED ORDER — IMIQUIMOD 2.5 % EX CREA: 1.0000 | TOPICAL_CREAM | Freq: Every evening | CUTANEOUS | 1 refills | Status: DC

## 2022-04-25 NOTE — Telephone Encounter (Signed)
Pt calling states her cream was not at the pharmacy, I resent it. Told her to call and make sure. Call me back if it was not.

## 2022-04-26 ENCOUNTER — Telehealth: Payer: Self-pay

## 2022-04-26 NOTE — Telephone Encounter (Signed)
Patient has been advised. KW 

## 2022-04-26 NOTE — Telephone Encounter (Signed)
Patient contacted office to inquire about pre-authorization for prescription Imiquimod. I checked on status on pre-authorization that I initiated and it has been denied by patients insurance. Patient would like to know if another prescription can be sent to pharmacy? Please advise. KW

## 2022-04-27 NOTE — Telephone Encounter (Signed)
Pt mother calling, extremely rude and cussing. Saying her daughter cannot afford the medication that was sent in. Insurance is not covering it. Requesting another option. Good RX is still $330 and she is very upset about that. Advised her that I would send this to [facility] but she is going to be out staring tomorrow for 2 weeks. Can you please advise if there is something else she can do?

## 2022-04-28 ENCOUNTER — Encounter: Payer: Self-pay | Admitting: Licensed Practical Nurse

## 2022-04-28 ENCOUNTER — Other Ambulatory Visit: Payer: Self-pay | Admitting: Licensed Practical Nurse

## 2022-04-28 DIAGNOSIS — A630 Anogenital (venereal) warts: Secondary | ICD-10-CM

## 2022-04-28 MED ORDER — IMIQUIMOD 5 % EX CREA
TOPICAL_CREAM | CUTANEOUS | 0 refills | Status: DC
Start: 2022-04-28 — End: 2022-05-15

## 2022-04-28 NOTE — Progress Notes (Signed)
Pt called reporting imiquimod cream is too expensive.  Script for generic sent Jannifer Hick   [facility]  04/28/22  3:15 PM

## 2022-04-28 NOTE — Telephone Encounter (Signed)
Kaidyn mom called triage again this morning, was very rude stating that her daughter needs her medicine now, that they cannot pay the 330$, mom even stated can something else be called in that her insurance will cover.   [facility] is out of the office for 2 weeks. Can you please advise?

## 2022-05-15 ENCOUNTER — Telehealth: Payer: Self-pay

## 2022-05-15 ENCOUNTER — Other Ambulatory Visit: Payer: Self-pay

## 2022-05-15 ENCOUNTER — Encounter: Payer: Self-pay | Admitting: Licensed Practical Nurse

## 2022-05-15 DIAGNOSIS — A630 Anogenital (venereal) warts: Secondary | ICD-10-CM

## 2022-05-15 MED ORDER — IMIQUIMOD 5 % EX CREA: TOPICAL_CREAM | CUTANEOUS | 3 refills | Status: DC

## 2022-05-15 NOTE — Telephone Encounter (Signed)
Patient called requesting refills for the Aldara cream. She said she only got five tiny packets and that was not enough. I sent in more refills to last her for 6 months. She also had some questions on how to use the medication. I advised her not use internally (inside her vagina). Please advise.

## 2022-05-16 NOTE — Telephone Encounter (Signed)
Pt calling; there is insurance issues with her rx.  [phone]  Left msg for pt to call with more specifics as to what the issues are and the med.

## 2022-05-16 NOTE — Telephone Encounter (Signed)
Pt returned call; states she only receives five pkts at a time and that is not enough med to help her.  What to do?  [phone]  [facility] who states ins only allows 5 pkts at a time and she p/u rx at [facility] 12/1; called [facility] who said it's an ins issue - pt needs to call her ins; pt aware to call ins; adv generic was sent in yesterday.  Pt to let us know if we need to do anything.

## 2022-09-20 ENCOUNTER — Telehealth: Payer: Self-pay

## 2022-09-20 ENCOUNTER — Other Ambulatory Visit: Payer: Self-pay | Admitting: Licensed Practical Nurse

## 2022-09-20 DIAGNOSIS — A630 Anogenital (venereal) warts: Secondary | ICD-10-CM

## 2022-09-20 MED ORDER — IMIQUIMOD 5 % EX CREA: TOPICAL_CREAM | CUTANEOUS | 3 refills | Status: DC

## 2022-09-20 NOTE — Telephone Encounter (Signed)
Pt calling for a refill of the last medication that was rx'd for her. (Aldara)  [phone]

## 2022-09-20 NOTE — Telephone Encounter (Signed)
Pt aware.

## 2022-10-09 ENCOUNTER — Ambulatory Visit: Payer: Medicaid Other

## 2022-11-14 NOTE — Progress Notes (Deleted)
I,Tanylah Schnoebelen,acting as a Neurosurgeon for [employer], PA-C.,have documented all relevant documentation on the behalf of Alfredia Ferguson, PA-C,as directed by  Alfredia Ferguson, PA-C while in the presence of Alfredia Ferguson, PA-C.   New patient visit   Patient: Teresa Mcmillan   DOB: 12-Mar-2001   21 y.o. Female  MRN: [id number] Visit Date: 11/15/2022  Today's healthcare provider: Alfredia Ferguson, PA-C   No chief complaint on file.  Subjective    Teresa Mcmillan is a 22 y.o. female who presents today as a new patient to establish care.  HPI  ***  Past Medical History:  Diagnosis Date   Asthma    Past Surgical History:  Procedure Laterality Date   bands     Mom reports constrictive bands on her legs and finger when she was 18 months.   club feet     Family Status  Relation Name Status   Youth worker  (Not Specified)   MGF  Deceased   PGM  Alive   Mother  Alive   Father  Alive   MGM  Alive   PGF  Deceased   Neg Hx  (Not Specified)   Family History  Problem Relation Age of Onset   Breast cancer Maternal Aunt    Cancer Maternal Grandfather    Hypertension Maternal Grandfather    Diabetes Paternal Grandmother    Migraines Mother    ADD / ADHD Mother    Hypertension Father    Kidney disease Father    Seizures Neg Hx    Autism Neg Hx    Anxiety disorder Neg Hx    Depression Neg Hx    Bipolar disorder Neg Hx    Schizophrenia Neg Hx    Social History   Socioeconomic History   Marital status: Single    Spouse name: Not on file   Number of children: Not on file   Years of education: Not on file   Highest education level: Not on file  Occupational History   Not on file  Tobacco Use   Smoking status: Never   Smokeless tobacco: Never  Vaping Use   Vaping Use: Some days  Substance and Sexual Activity   Alcohol use: No   Drug use: No   Sexual activity: Not Currently    Birth control/protection: Implant  Other Topics Concern   Not on file  Social History Narrative    Patient lives at home with mom and granmother. She is in the 9th grade at [employer]. She does well in school. She enjoys math, volleyball and swimming   Social Determinants of Corporate investment banker Strain: Not on file  Food Insecurity: Not on file  Transportation Needs: Not on file  Physical Activity: Not on file  Stress: Not on file  Social Connections: Not on file   Outpatient Medications Prior to Visit  Medication Sig   albuterol (PROVENTIL) (5 MG/ML) 0.5% nebulizer solution Take 2.5 mg by nebulization every 6 (six) hours as needed for Wheezing.   brompheniramine-pseudoephedrine-DM 30-2-10 MG/5ML syrup Take 5 mLs by mouth 4 (four) times daily as needed.   cetirizine HCl (ZYRTEC) 5 MG/5ML SOLN Take by mouth.   diphenhydrAMINE (BENADRYL) 25 MG tablet Take 1 tablet (25 mg total) by mouth every 6 (six) hours.   EPINEPHrine (EPIPEN JR) 0.15 MG/0.3ML injection Inject 0.15 mg into the muscle as needed for anaphylaxis.   etonogestrel (NEXPLANON) 68 MG IMPL implant 1 each by Subdermal route once.  imiquimod (ALDARA) 5 % cream Apply topically 3 (three) times a week.   ketoconazole (NIZORAL) 2 % shampoo APPLY TO THE AFFECTED AREAS LATHER LEAVE IN PLACE FOR 5 MINUTES, ...  (REFER TO PRESCRIPTION NOTES).   Multiple Vitamins-Minerals (MULTIVITAMIN) tablet Take 1 tablet by mouth daily.   phenazopyridine (PYRIDIUM) 200 MG tablet Take 1 tablet (200 mg total) by mouth 3 (three) times daily.   trimethoprim-polymyxin b (POLYTRIM) ophthalmic solution Place 2 drops into the left eye every 4 (four) hours. (Patient not taking: Reported on 04/17/2022)   No facility-administered medications prior to visit.   Allergies  Allergen Reactions   Amoxicillin Hives   Penicillins    Shellfish Allergy     Immunization History  Administered Date(s) Administered   Influenza-Unspecified 07/02/2020   PPD Test 07/05/2020, 07/16/2020    Health Maintenance  Topic Date Due   COVID-19 Vaccine (1) Never  done   HPV VACCINES (1 - 2-dose series) Never done   HIV Screening  Never done   Hepatitis C Screening  Never done   DTaP/Tdap/Td (1 - Tdap) Never done   INFLUENZA VACCINE  12/28/2022   CHLAMYDIA SCREENING  02/22/2023    Patient Care Team: Pcp, No as PCP [facility]  Review of Systems  {Labs  Heme  Chem  Endocrine  Serology  Results Review (optional):23779}   Objective    There were no vitals taken for this visit. {Show previous Jarad Barth signs (optional):23777}  Physical Exam ***  Depression Screen     No data to display         No results found for any visits on 11/15/22.  Assessment & Plan     ***  No follow-ups on file.     {provider attestation***:1}   Alfredia Ferguson, PA-C  [facility] [phone] (phone) [phone] (fax)  [facility]

## 2022-11-15 ENCOUNTER — Ambulatory Visit: Payer: Medicaid Other | Admitting: Physician Assistant

## 2022-12-03 NOTE — Progress Notes (Unsigned)
Pcp, No   No chief complaint on file.   HPI:      Ms. Teresa Mcmillan is a 22 y.o. G0P0000 whose LMP was No LMP recorded. Patient has had an implant., presents today for *** On imiquimord since 4/24?   Patient Active Problem List   Diagnosis Date Noted   Vaginal discharge 12/07/2020   Tension headache 08/07/2017   Migraine without aura and without status migrainosus, not intractable 08/07/2017   Secondary amenorrhea 07/31/2017   Unspecified asthma(493.90) 11/12/2012    Past Surgical History:  Procedure Laterality Date   bands     Mom reports constrictive bands on her legs and finger when she was 18 months.   club feet      Family History  Problem Relation Age of Onset   Breast cancer Maternal Aunt    Cancer Maternal Grandfather    Hypertension Maternal Grandfather    Diabetes Paternal Grandmother    Migraines Mother    ADD / ADHD Mother    Hypertension Father    Kidney disease Father    Seizures Neg Hx    Autism Neg Hx    Anxiety disorder Neg Hx    Depression Neg Hx    Bipolar disorder Neg Hx    Schizophrenia Neg Hx     Social History   Socioeconomic History   Marital status: Single    Spouse name: Not on file   Number of children: Not on file   Years of education: Not on file   Highest education level: Not on file  Occupational History   Not on file  Tobacco Use   Smoking status: Never   Smokeless tobacco: Never  Vaping Use   Vaping Use: Some days  Substance and Sexual Activity   Alcohol use: No   Drug use: No   Sexual activity: Not Currently    Birth control/protection: Implant  Other Topics Concern   Not on file  Social History Narrative   Patient lives at home with mom and granmother. She is in the 9th grade at [employer]. She does well in school. She enjoys math, volleyball and swimming   Social Determinants of Corporate investment banker Strain: Not on file  Food Insecurity: Not on file  Transportation Needs: Not on file  Physical  Activity: Not on file  Stress: Not on file  Social Connections: Not on file  Intimate Partner Violence: Not on file    Outpatient Medications Prior to Visit  Medication Sig Dispense Refill   albuterol (PROVENTIL) (5 MG/ML) 0.5% nebulizer solution Take 2.5 mg by nebulization every 6 (six) hours as needed for Wheezing.     brompheniramine-pseudoephedrine-DM 30-2-10 MG/5ML syrup Take 5 mLs by mouth 4 (four) times daily as needed. 120 mL 0   cetirizine HCl (ZYRTEC) 5 MG/5ML SOLN Take by mouth.     diphenhydrAMINE (BENADRYL) 25 MG tablet Take 1 tablet (25 mg total) by mouth every 6 (six) hours. 20 tablet 0   EPINEPHrine (EPIPEN JR) 0.15 MG/0.3ML injection Inject 0.15 mg into the muscle as needed for anaphylaxis.     etonogestrel (NEXPLANON) 68 MG IMPL implant 1 each by Subdermal route once.     imiquimod (ALDARA) 5 % cream Apply topically 3 (three) times a week. 24 each 3   ketoconazole (NIZORAL) 2 % shampoo APPLY TO THE AFFECTED AREAS LATHER LEAVE IN PLACE FOR 5 MINUTES, ...  (REFER TO PRESCRIPTION NOTES).  1   Multiple Vitamins-Minerals (MULTIVITAMIN) tablet Take  1 tablet by mouth daily. 100 tablet 0   phenazopyridine (PYRIDIUM) 200 MG tablet Take 1 tablet (200 mg total) by mouth 3 (three) times daily. 6 tablet 0   trimethoprim-polymyxin b (POLYTRIM) ophthalmic solution Place 2 drops into the left eye every 4 (four) hours. (Patient not taking: Reported on 04/17/2022) 10 mL 0   No facility-administered medications prior to visit.      ROS:  Review of Systems BREAST: No symptoms   OBJECTIVE:   Vitals:  There were no vitals taken for this visit.  Physical Exam  Results: No results found for this or any previous visit (from the past 24 hour(s)).   Assessment/Plan: No diagnosis found.    No orders of the defined types were placed in this encounter.     No follow-ups on file.  Jalisa Sacco B. Jules Baty, PA-C 12/03/2022 4:56 PM

## 2022-12-04 ENCOUNTER — Ambulatory Visit (INDEPENDENT_AMBULATORY_CARE_PROVIDER_SITE_OTHER): Payer: Medicaid Other | Admitting: Obstetrics and Gynecology

## 2022-12-04 ENCOUNTER — Encounter: Payer: Self-pay | Admitting: Obstetrics and Gynecology

## 2022-12-04 VITALS — BP 106/74 | Ht 65.0 in | Wt 178.0 lb

## 2022-12-04 DIAGNOSIS — Z3202 Encounter for pregnancy test, result negative: Secondary | ICD-10-CM

## 2022-12-04 DIAGNOSIS — A630 Anogenital (venereal) warts: Secondary | ICD-10-CM | POA: Diagnosis not present

## 2022-12-04 DIAGNOSIS — Z309 Encounter for contraceptive management, unspecified: Secondary | ICD-10-CM

## 2022-12-04 LAB — POCT URINE PREGNANCY: Preg Test, Ur: NEGATIVE

## 2023-05-21 ENCOUNTER — Ambulatory Visit: Payer: Medicaid Other | Admitting: Nurse Practitioner

## 2023-05-21 NOTE — Progress Notes (Deleted)
   There were no vitals taken for this visit.   Subjective:    Patient ID: Teresa Mcmillan, female    DOB: 2000-07-20, 21 y.o.   MRN: [id number]  HPI: Teresa Mcmillan is a 22 y.o. female  No chief complaint on file.  Patient presents to clinic to establish care with new PCP.  Introduced to Publishing rights manager role and practice setting.  All questions answered.  Discussed provider/patient relationship and expectations.  Patient reports a history of ***. Patient denies a history of: Hypertension, Elevated Cholesterol, Diabetes, Thyroid problems, Depression, Anxiety, Neurological problems, and Abdominal problems.   Active Ambulatory Problems    Diagnosis Date Noted   Asthma 11/12/2012   Secondary amenorrhea 07/31/2017   Tension headache 08/07/2017   Migraine without aura and without status migrainosus, not intractable 08/07/2017   Vaginal discharge 12/07/2020   Resolved Ambulatory Problems    Diagnosis Date Noted   No Resolved Ambulatory Problems   No Additional Past Medical History   Past Surgical History:  Procedure Laterality Date   bands     Mom reports constrictive bands on her legs and finger when she was 18 months.   club feet     Family History  Problem Relation Age of Onset   Migraines Mother    ADD / ADHD Mother    Hypertension Father    Kidney disease Father    Breast cancer Maternal Aunt        unsure of age   Hypertension Maternal Grandfather    Lung cancer Maternal Grandfather    Diabetes Paternal Grandmother    Seizures Neg Hx    Autism Neg Hx    Anxiety disorder Neg Hx    Depression Neg Hx    Bipolar disorder Neg Hx    Schizophrenia Neg Hx      Review of Systems  Per HPI unless specifically indicated above     Objective:    There were no vitals taken for this visit.  Wt Readings from Last 3 Encounters:  12/04/22 178 lb (80.7 kg)  04/17/22 154 lb (69.9 kg) (83%, Z= 0.96)*  02/21/22 151 lb 8 oz (68.7 kg) (81%, Z= 0.89)*   * Growth percentiles  are based on CDC (Girls, 2-20 Years) data.    Physical Exam  Results for orders placed or performed in visit on 12/04/22  POCT urine pregnancy   Collection Time: 12/04/22  4:08 PM  Result Value Ref Range   Preg Test, Ur Negative Negative      Assessment & Plan:   Problem List Items Addressed This Visit   None    Follow up plan: No follow-ups on file.

## 2023-06-21 ENCOUNTER — Ambulatory Visit: Payer: Medicaid Other | Admitting: Cardiology

## 2023-06-25 ENCOUNTER — Ambulatory Visit: Payer: Medicaid Other | Admitting: Internal Medicine

## 2023-06-25 ENCOUNTER — Encounter: Payer: Self-pay | Admitting: Internal Medicine

## 2023-06-25 VITALS — BP 110/80 | HR 85 | Ht 65.0 in | Wt 187.0 lb

## 2023-06-25 DIAGNOSIS — R519 Headache, unspecified: Secondary | ICD-10-CM | POA: Insufficient documentation

## 2023-06-25 DIAGNOSIS — N946 Dysmenorrhea, unspecified: Secondary | ICD-10-CM | POA: Diagnosis not present

## 2023-06-25 DIAGNOSIS — R110 Nausea: Secondary | ICD-10-CM

## 2023-06-25 DIAGNOSIS — R7303 Prediabetes: Secondary | ICD-10-CM | POA: Insufficient documentation

## 2023-06-25 DIAGNOSIS — Z0130 Encounter for examination of blood pressure without abnormal findings: Secondary | ICD-10-CM

## 2023-06-25 MED ORDER — LORATADINE 10 MG PO CAPS: 1.0000 | ORAL_CAPSULE | Freq: Every day | ORAL | 3 refills | Status: AC

## 2023-06-25 MED ORDER — FLUTICASONE PROPIONATE 50 MCG/ACT NA SUSP
1.0000 | Freq: Every day | NASAL | 2 refills | Status: AC
Start: 2023-06-25 — End: ?

## 2023-06-25 NOTE — Progress Notes (Signed)
New Patient Office Visit  Subjective   Patient ID: Teresa Mcmillan, female    DOB: [birth year]  Age: 23 y.o. MRN: [id number]  CC:  Chief Complaint  Patient presents with   Establish Care    NPE    HPI BERNARDINA CACHO presents to establish care Previous Primary Care provider/office:   she does have additional concerns to discuss today.   Patient comes in to establish PMD, accompanied by her boyfriend.  Today she reports that she has history of recurrent sinus headaches.  She feels the pain behind her frontal sinuses along with nasal congestion.  She has not seen an ENT before for this.  She was prescribed NSAIDs for the headache.  She has never been diagnosed with migraine headaches.  Recently these headaches are becoming more frequent and they are associated with nausea which made her more concerned.  There is no tingling or numbness associated with these headaches.  And there is no photophobia or phonophobia.  Patient reports of  nasal congestion but there is no significant nasal discharge or postnasal drip.  She does not have a cough, no fevers and no chills. At her last eye exam she was told she needs to wear glasses, but she is not wearing them.  Patient advised to start correcting her vision as it can contribute to her headaches. She has never had a Pap smear before.  She used to have Nexplanon to help with menstrual cramps, but it was removed a month ago.  And now she is having severe cramps with her cycle.  Will set up a GYN referral for Pap and contraceptive management. Patient was told she has prediabetes, needs blood work today.    Outpatient Encounter Medications as of 06/25/2023  Medication Sig   albuterol (PROVENTIL) (5 MG/ML) 0.5% nebulizer solution Take 2.5 mg by nebulization every 6 (six) hours as needed for Wheezing.   fluticasone (FLONASE) 50 MCG/ACT nasal spray Place 1 spray into both nostrils daily.   Loratadine 10 MG CAPS Take 1 capsule (10 mg total) by mouth daily.    benzonatate (TESSALON) 200 MG capsule Take 200 mg by mouth 3 (three) times daily as needed. (Patient not taking: Reported on 06/25/2023)   etonogestrel (NEXPLANON) 68 MG IMPL implant 1 each by Subdermal route once. (Patient not taking: Reported on 06/25/2023)   imiquimod (ALDARA) 5 % cream Apply topically 3 (three) times a week. (Patient not taking: Reported on 06/25/2023)   mupirocin ointment (BACTROBAN) 2 % Apply topically 2 (two) times daily. (Patient not taking: Reported on 06/25/2023)   No facility-administered encounter medications on file as of 06/25/2023.    Past Medical History:  Diagnosis Date   Asthma     Past Surgical History:  Procedure Laterality Date   bands     Mom reports constrictive bands on her legs and finger when she was 18 months.   club feet      Family History  Problem Relation Age of Onset   Migraines Mother    ADD / ADHD Mother    Hypertension Father    Kidney disease Father    Breast cancer Maternal Aunt        unsure of age   Hypertension Maternal Grandfather    Lung cancer Maternal Grandfather    Diabetes Paternal Grandmother    Seizures Neg Hx    Autism Neg Hx    Anxiety disorder Neg Hx    Depression Neg Hx    Bipolar disorder Neg  Hx    Schizophrenia Neg Hx     Social History   Socioeconomic History   Marital status: Single    Spouse name: Not on file   Number of children: Not on file   Years of education: Not on file   Highest education level: Not on file  Occupational History   Not on file  Tobacco Use   Smoking status: Never   Smokeless tobacco: Never  Vaping Use   Vaping status: Some Days  Substance and Sexual Activity   Alcohol use: No   Drug use: No   Sexual activity: Yes    Birth control/protection: Implant, Condom  Other Topics Concern   Not on file  Social History Narrative   Patient lives at home with mom and granmother. She is in the 9th grade at [employer]. She does well in school. She enjoys math, volleyball and  swimming   Social Drivers of Health   Financial Resource Strain: Low Risk  (12/11/2021)   Received from [facility], [facility]   Overall Financial Resource Strain (CARDIA)    Difficulty of Paying Living Expenses: Not hard at all  Food Insecurity: No Food Insecurity (12/11/2021)   Received from [facility], [facility]   Hunger Vital Sign    Worried About Running Out of Food in the Last Year: Never true    Ran Out of Food in the Last Year: Never true  Transportation Needs: No Transportation Needs (12/11/2021)   Received from [facility], [facility]   [facility] - Transportation    In the past 12 months, has lack of transportation kept you from medical appointments or from getting medications?: No    Lack of Transportation (Non-Medical): No  Physical Activity: Not on file  Stress: Not on file  Social Connections: Not on file  Intimate Partner Violence: Not on file    Review of Systems  Constitutional: Negative.   HENT: Negative.    Eyes: Negative.   Respiratory: Negative.  Negative for cough and shortness of breath.   Cardiovascular: Negative.  Negative for chest pain, palpitations and leg swelling.  Gastrointestinal:  Positive for nausea. Negative for abdominal pain, constipation, diarrhea, heartburn and vomiting.  Genitourinary: Negative.  Negative for dysuria and flank pain.  Musculoskeletal: Negative.  Negative for joint pain and myalgias.  Skin: Negative.   Neurological:  Positive for headaches. Negative for dizziness.  Endo/Heme/Allergies: Negative.   Psychiatric/Behavioral: Negative.  Negative for depression and suicidal ideas. The patient is not nervous/anxious.         Objective   BP 110/80   Pulse 85   Ht 5\' 5"  (1.651 m)   Wt 187 lb (84.8 kg)   SpO2 98%   BMI 31.12 kg/m   Physical Exam Vitals and nursing note reviewed.  Constitutional:       Appearance: Normal appearance.  HENT:     Head: Normocephalic and atraumatic.     Nose: Nose normal.     Mouth/Throat:     Mouth: Mucous membranes are moist.     Pharynx: Oropharynx is clear.  Eyes:     Conjunctiva/sclera: Conjunctivae normal.     Pupils: Pupils are equal, round, and reactive to light.  Cardiovascular:     Rate and Rhythm: Normal rate and regular rhythm.     Pulses: Normal pulses.     Heart sounds: Normal heart sounds. No murmur heard. Pulmonary:  Effort: Pulmonary effort is normal.     Breath sounds: Normal breath sounds. No wheezing.  Abdominal:     General: Bowel sounds are normal.     Palpations: Abdomen is soft.     Tenderness: There is no abdominal tenderness. There is no right CVA tenderness or left CVA tenderness.  Musculoskeletal:        General: Normal range of motion.     Cervical back: Normal range of motion.     Right lower leg: No edema.     Left lower leg: No edema.  Skin:    General: Skin is warm and dry.  Neurological:     General: No focal deficit present.     Mental Status: She is alert and oriented to person, place, and time.  Psychiatric:        Mood and Affect: Mood normal.        Behavior: Behavior normal.        Assessment & Plan:  Schedule CT of the paranasal sinuses.  Start Flonase and Claritin.. Blood work today. GYN referral. Problem List Items Addressed This Visit   None Visit Diagnoses       Prediabetes    -  Primary   Relevant Orders   CMP14+EGFR   Hemoglobin A1c     Nausea       Relevant Orders   CBC with Diff     Sinus headache       Relevant Medications   fluticasone (FLONASE) 50 MCG/ACT nasal spray   Loratadine 10 MG CAPS   Other Relevant Orders   Sed Rate (ESR)   CT MAXILLOFACIAL WO CONTRAST     Severe menstrual cramps       Relevant Orders   Ambulatory referral to Obstetrics / Gynecology       Return in about 10 days (around 07/05/2023).   Total time spent: 30 minutes  Margaretann Loveless,  MD  06/25/2023   This document may have been prepared by [facility] Voice Recognition software and as such may include unintentional dictation errors.

## 2023-06-26 LAB — CBC WITH DIFFERENTIAL/PLATELET
Basophils Absolute: 0 10*3/uL (ref 0.0–0.2)
Basos: 1 %
EOS (ABSOLUTE): 0.1 10*3/uL (ref 0.0–0.4)
Eos: 2 %
Hematocrit: 41.3 % (ref 34.0–46.6)
Hemoglobin: 12.8 g/dL (ref 11.1–15.9)
Immature Grans (Abs): 0 10*3/uL (ref 0.0–0.1)
Immature Granulocytes: 0 %
Lymphocytes Absolute: 2 10*3/uL (ref 0.7–3.1)
Lymphs: 51 %
MCH: 27.5 pg (ref 26.6–33.0)
MCHC: 31 g/dL — ABNORMAL LOW (ref 31.5–35.7)
MCV: 89 fL (ref 79–97)
Monocytes Absolute: 0.3 10*3/uL (ref 0.1–0.9)
Monocytes: 7 %
Neutrophils Absolute: 1.5 10*3/uL (ref 1.4–7.0)
Neutrophils: 39 %
Platelets: 241 10*3/uL (ref 150–450)
RBC: 4.66 x10E6/uL (ref 3.77–5.28)
RDW: 13.3 % (ref 11.7–15.4)
WBC: 3.8 10*3/uL (ref 3.4–10.8)

## 2023-06-26 LAB — CMP14+EGFR
ALT: 14 [IU]/L (ref 0–32)
AST: 19 [IU]/L (ref 0–40)
Albumin: 4.2 g/dL (ref 4.0–5.0)
Alkaline Phosphatase: 61 [IU]/L (ref 44–121)
BUN/Creatinine Ratio: 20 (ref 9–23)
BUN: 20 mg/dL (ref 6–20)
Bilirubin Total: <0.2 mg/dL (ref 0.0–1.2)
CO2: 25 mmol/L (ref 20–29)
Calcium: 9.6 mg/dL (ref 8.7–10.2)
Chloride: 100 mmol/L (ref 96–106)
Creatinine, Ser: 1 mg/dL (ref 0.57–1.00)
Globulin, Total: 3.1 g/dL (ref 1.5–4.5)
Glucose: 69 mg/dL — ABNORMAL LOW (ref 70–99)
Potassium: 4.7 mmol/L (ref 3.5–5.2)
Sodium: 139 mmol/L (ref 134–144)
Total Protein: 7.3 g/dL (ref 6.0–8.5)
eGFR: 82 mL/min/{1.73_m2} (ref >59)

## 2023-06-26 LAB — HEMOGLOBIN A1C
Est. average glucose Bld gHb Est-mCnc: 120 mg/dL
Hgb A1c MFr Bld: 5.8 % — ABNORMAL HIGH (ref 4.8–5.6)

## 2023-06-26 LAB — SEDIMENTATION RATE: Sed Rate: 23 mm/h (ref 0–32)

## 2023-07-06 ENCOUNTER — Ambulatory Visit: Payer: Medicaid Other | Admitting: Internal Medicine

## 2023-07-17 ENCOUNTER — Other Ambulatory Visit: Payer: Medicaid Other

## 2023-07-31 ENCOUNTER — Ambulatory Visit: Payer: Medicaid Other

## 2023-07-31 DIAGNOSIS — R519 Headache, unspecified: Secondary | ICD-10-CM | POA: Diagnosis not present

## 2023-08-01 ENCOUNTER — Ambulatory Visit: Payer: Medicaid Other | Admitting: Certified Nurse Midwife

## 2023-08-01 VITALS — BP 107/72 | HR 84 | Ht 65.0 in | Wt 183.3 lb

## 2023-08-01 DIAGNOSIS — N946 Dysmenorrhea, unspecified: Secondary | ICD-10-CM

## 2023-08-01 DIAGNOSIS — N920 Excessive and frequent menstruation with regular cycle: Secondary | ICD-10-CM

## 2023-08-01 MED ORDER — ALBUTEROL SULFATE HFA 108 (90 BASE) MCG/ACT IN AERS: 2.0000 | INHALATION_SPRAY | Freq: Four times a day (QID) | RESPIRATORY_TRACT | 2 refills | Status: AC | PRN

## 2023-08-01 MED ORDER — IBUPROFEN 800 MG PO TABS
800.0000 mg | ORAL_TABLET | Freq: Three times a day (TID) | ORAL | 1 refills | Status: AC | PRN
Start: 2023-08-01 — End: ?

## 2023-08-01 NOTE — Progress Notes (Signed)
 Margaretann Loveless, MD   No chief complaint on file.   HPI:      Teresa Mcmillan is a 23 y.o. G0P0000 whose LMP was Patient's last menstrual period was 07/20/2023 (exact date)., presents today for painful periods. She has used Depo & Nexplanon in the past to manage her cycles. She would prefer not to use a hormonal method at this time. 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     Patient Active Problem List   Diagnosis Date Noted   Prediabetes 06/25/2023   Severe menstrual cramps 06/25/2023   Sinus headache 06/25/2023   Vaginal discharge 12/07/2020   Tension headache 08/07/2017   Migraine without aura and without status migrainosus, not intractable 08/07/2017   Secondary amenorrhea 07/31/2017   Asthma 11/12/2012    Past Surgical History:  Procedure Laterality Date   bands     Mom reports constrictive bands on her legs and finger when she was 18 months.   club feet      Family History  Problem Relation Age of Onset   Migraines Mother    ADD / ADHD Mother    Hypertension Father    Kidney disease Father    Breast cancer Maternal Aunt        unsure of age   Hypertension Maternal Grandfather    Lung cancer Maternal Grandfather    Diabetes Paternal Grandmother    Seizures Neg Hx    Autism Neg Hx    Anxiety disorder Neg Hx    Depression Neg Hx    Bipolar disorder Neg Hx    Schizophrenia Neg Hx     Social History   Socioeconomic History   Marital status: Single    Spouse name: Not on file   Number of children: Not on file   Years of education: Not on file   Highest education level: Not on file  Occupational History   Not on file  Tobacco Use   Smoking status: Never   Smokeless tobacco: Never  Vaping Use   Vaping status: Some Days  Substance and Sexual Activity   Alcohol use: No   Drug use: No   Sexual activity: Yes    Birth control/protection: Implant, Condom  Other Topics Concern   Not on file  Social History Narrative    Patient lives at home with mom and granmother. She is in the 9th grade at [employer]. She does well in school. She enjoys math, volleyball and swimming   Social Drivers of Health   Financial Resource Strain: Low Risk  (12/11/2021)   Received from [facility], [facility]   Overall Financial Resource Strain (CARDIA)    Difficulty of Paying Living Expenses: Not hard at all  Food Insecurity: No Food Insecurity (12/11/2021)   Received from [facility], [facility]   Hunger Vital Sign    Worried About Running Out of Food in the Last Year: Never true    Ran Out of Food in the Last Year: Never true  Transportation Needs: No Transportation Needs (12/11/2021)   Received from [facility], [facility]   [facility] - Transportation    In the past 12 months, has lack of transportation kept you from medical appointments or from getting medications?: No    Lack of Transportation (Non-Medical): No  Physical Activity: Not on file  Stress: Not on file  Social Connections: Not on  file  Intimate Partner Violence: Not on file    Outpatient Medications Prior to Visit  Medication Sig Dispense Refill   Loratadine 10 MG CAPS Take 1 capsule (10 mg total) by mouth daily. 30 capsule 3   benzonatate (TESSALON) 200 MG capsule Take 200 mg by mouth 3 (three) times daily as needed.     etonogestrel (NEXPLANON) 68 MG IMPL implant 1 each by Subdermal route once. (Patient not taking: Reported on 06/25/2023)     fluticasone (FLONASE) 50 MCG/ACT nasal spray Place 1 spray into both nostrils daily. (Patient not taking: Reported on 08/01/2023) 11.1 mL 2   imiquimod (ALDARA) 5 % cream Apply topically 3 (three) times a week. (Patient not taking: Reported on 08/01/2023) 24 each 3   mupirocin ointment (BACTROBAN) 2 % Apply topically 2 (two) times daily. (Patient not taking: Reported on 08/01/2023)     albuterol (PROVENTIL) (5 MG/ML)  0.5% nebulizer solution Take 2.5 mg by nebulization every 6 (six) hours as needed for Wheezing. (Patient not taking: Reported on 08/01/2023)     No facility-administered medications prior to visit.      ROS:  Review of Systems  Constitutional: Negative.   Respiratory: Negative.    Cardiovascular: Negative.   Genitourinary:  Positive for menstrual problem.     OBJECTIVE:   Vitals:  BP 107/72   Pulse 84   Ht 5\' 5"  (1.651 m)   Wt 183 lb 4.8 oz (83.1 kg)   LMP 07/20/2023 (Exact Date)   BMI 30.50 kg/m   Physical Exam Constitutional:      Appearance: Normal appearance.  Cardiovascular:     Rate and Rhythm: Normal rate.  Pulmonary:     Effort: Pulmonary effort is normal.  Neurological:     General: No focal deficit present.     Mental Status: She is alert and oriented to person, place, and time.  Psychiatric:        Mood and Affect: Mood normal.        Behavior: Behavior normal.     Results: No results found for this or any previous visit (from the past 24 hours).   Assessment/Plan: Dysmenorrhea - Plan: ibuprofen (ADVIL) 800 MG tablet  Menorrhagia with regular cycle - Plan: ibuprofen (ADVIL) 800 MG tablet   Trial ibuprofen prior to cycle and continuing through first two days of bleeding. Will schedule annual exam for pap & exam of possible HPV related skin changes. Meds ordered this encounter  Medications   ibuprofen (ADVIL) 800 MG tablet    Sig: Take 1 tablet (800 mg total) by mouth every 8 (eight) hours as needed for cramping. Take 1 tablet every hours beginning 1 day prior to menstrual cycle and continuing for first two days of bleeding.    Dispense:  60 tablet    Refill:  1   albuterol (VENTOLIN HFA) 108 (90 Base) MCG/ACT inhaler    Sig: Inhale 2 puffs into the lungs every 6 (six) hours as needed for wheezing or shortness of breath.    Dispense:  8 g    Refill:  2     Dominica Severin, CNM

## 2023-08-03 ENCOUNTER — Ambulatory Visit: Payer: Medicaid Other | Admitting: Internal Medicine

## 2023-08-03 ENCOUNTER — Encounter: Payer: Self-pay | Admitting: Internal Medicine

## 2023-08-03 VITALS — BP 102/68 | HR 103 | Ht 65.0 in | Wt 185.0 lb

## 2023-08-03 DIAGNOSIS — R519 Headache, unspecified: Secondary | ICD-10-CM | POA: Diagnosis not present

## 2023-08-03 DIAGNOSIS — R7303 Prediabetes: Secondary | ICD-10-CM | POA: Diagnosis not present

## 2023-08-03 DIAGNOSIS — N946 Dysmenorrhea, unspecified: Secondary | ICD-10-CM

## 2023-08-03 DIAGNOSIS — Z0130 Encounter for examination of blood pressure without abnormal findings: Secondary | ICD-10-CM

## 2023-08-03 NOTE — Progress Notes (Signed)
 Established Patient Office Visit  Subjective:  Patient ID: Teresa Mcmillan, female    DOB: 05/12/2001  Age: 23 y.o. MRN: [id number]  Chief Complaint  Patient presents with   Follow-up    10 day follow up, CT results     Patient comes in for her follow-up today.  She reports that her headaches have gone away almost completely and she is not using her Flonase nasal spray or her Claritin.  She had her CT scan of the sinuses done but the report is not available.   She has also not picked up her eyeglasses yet. She was seen by the OB/GYN and advised to use ibuprofen for menstrual cramps.  She will return in 1 month for a Pap smear and to discuss contraceptive measures. Lab results discussed today, needs to avoid concentrated sweets as her hemoglobin A1c is 5.8.    No other concerns at this time.   Past Medical History:  Diagnosis Date   Asthma     Past Surgical History:  Procedure Laterality Date   bands     Mom reports constrictive bands on her legs and finger when she was 18 months.   club feet      Social History   Socioeconomic History   Marital status: Single    Spouse name: Not on file   Number of children: Not on file   Years of education: Not on file   Highest education level: Not on file  Occupational History   Not on file  Tobacco Use   Smoking status: Never   Smokeless tobacco: Never  Vaping Use   Vaping status: Some Days  Substance and Sexual Activity   Alcohol use: No   Drug use: No   Sexual activity: Yes    Birth control/protection: Implant, Condom  Other Topics Concern   Not on file  Social History Narrative   Patient lives at home with mom and granmother. She is in the 9th grade at [employer]. She does well in school. She enjoys math, volleyball and swimming   Social Drivers of Health   Financial Resource Strain: Low Risk  (12/11/2021)   Received from [facility], [facility]   Overall Financial Resource  Strain (CARDIA)    Difficulty of Paying Living Expenses: Not hard at all  Food Insecurity: No Food Insecurity (12/11/2021)   Received from [facility], [facility]   Hunger Vital Sign    Worried About Running Out of Food in the Last Year: Never true    Ran Out of Food in the Last Year: Never true  Transportation Needs: No Transportation Needs (12/11/2021)   Received from [facility], [facility]   [facility] - Transportation    In the past 12 months, has lack of transportation kept you from medical appointments or from getting medications?: No    Lack of Transportation (Non-Medical): No  Physical Activity: Not on file  Stress: Not on file  Social Connections: Not on file  Intimate Partner Violence: Not on file    Family History  Problem Relation Age of Onset   Migraines Mother    ADD / ADHD Mother    Hypertension Father    Kidney disease Father    Breast cancer Maternal Aunt        unsure of age   Hypertension Maternal Grandfather    Lung cancer Maternal Grandfather    Diabetes Paternal Grandmother  Seizures Neg Hx    Autism Neg Hx    Anxiety disorder Neg Hx    Depression Neg Hx    Bipolar disorder Neg Hx    Schizophrenia Neg Hx     Allergies  Allergen Reactions   Amoxicillin Hives   Penicillins    Shellfish Allergy     Outpatient Medications Prior to Visit  Medication Sig   Loratadine 10 MG CAPS Take 1 capsule (10 mg total) by mouth daily.   albuterol (VENTOLIN HFA) 108 (90 Base) MCG/ACT inhaler Inhale 2 puffs into the lungs every 6 (six) hours as needed for wheezing or shortness of breath. (Patient not taking: Reported on 08/03/2023)   etonogestrel (NEXPLANON) 68 MG IMPL implant 1 each by Subdermal route once. (Patient not taking: Reported on 08/03/2023)   fluticasone (FLONASE) 50 MCG/ACT nasal spray Place 1 spray into both nostrils daily. (Patient not taking: Reported on 08/03/2023)   ibuprofen (ADVIL)  800 MG tablet Take 1 tablet (800 mg total) by mouth every 8 (eight) hours as needed for cramping. Take 1 tablet every hours beginning 1 day prior to menstrual cycle and continuing for first two days of bleeding. (Patient not taking: Reported on 08/03/2023)   imiquimod (ALDARA) 5 % cream Apply topically 3 (three) times a week. (Patient not taking: Reported on 06/25/2023)   mupirocin ointment (BACTROBAN) 2 % Apply topically 2 (two) times daily. (Patient not taking: Reported on 06/25/2023)   [DISCONTINUED] benzonatate (TESSALON) 200 MG capsule Take 200 mg by mouth 3 (three) times daily as needed. (Patient not taking: Reported on 08/03/2023)   No facility-administered medications prior to visit.    Review of Systems  Constitutional: Negative.  Negative for chills, fever and weight loss.  HENT:  Negative for congestion, ear discharge, sinus pain and sore throat.   Eyes: Negative.   Respiratory: Negative.  Negative for cough and shortness of breath.   Cardiovascular: Negative.  Negative for chest pain, palpitations and leg swelling.  Gastrointestinal: Negative.  Negative for abdominal pain, constipation, diarrhea, heartburn, nausea and vomiting.  Genitourinary: Negative.  Negative for dysuria and flank pain.  Musculoskeletal: Negative.  Negative for joint pain and myalgias.  Skin: Negative.   Neurological: Negative.  Negative for dizziness and headaches.  Endo/Heme/Allergies: Negative.   Psychiatric/Behavioral: Negative.  Negative for depression and suicidal ideas. The patient is not nervous/anxious.        Objective:   BP 102/68   Pulse (!) 103   Ht 5\' 5"  (1.651 m)   Wt 185 lb (83.9 kg)   LMP 07/20/2023 (Exact Date)   SpO2 97%   BMI 30.79 kg/m   Vitals:   08/03/23 0933  BP: 102/68  Pulse: (!) 103  Height: 5\' 5"  (1.651 m)  Weight: 185 lb (83.9 kg)  SpO2: 97%  BMI (Calculated): 30.79    Physical Exam Vitals and nursing note reviewed.  Constitutional:      Appearance: Normal  appearance.  HENT:     Head: Normocephalic and atraumatic.     Nose: Nose normal.     Mouth/Throat:     Mouth: Mucous membranes are moist.     Pharynx: Oropharynx is clear.  Eyes:     Conjunctiva/sclera: Conjunctivae normal.     Pupils: Pupils are equal, round, and reactive to light.  Cardiovascular:     Rate and Rhythm: Normal rate and regular rhythm.     Pulses: Normal pulses.     Heart sounds: Normal heart sounds. No murmur heard. Pulmonary:  Effort: Pulmonary effort is normal.     Breath sounds: Normal breath sounds. No wheezing.  Abdominal:     General: Bowel sounds are normal.     Palpations: Abdomen is soft.     Tenderness: There is no abdominal tenderness. There is no right CVA tenderness or left CVA tenderness.  Musculoskeletal:        General: Normal range of motion.     Cervical back: Normal range of motion.     Right lower leg: No edema.     Left lower leg: No edema.  Skin:    General: Skin is warm and dry.  Neurological:     General: No focal deficit present.     Mental Status: She is alert and oriented to person, place, and time.  Psychiatric:        Mood and Affect: Mood normal.        Behavior: Behavior normal.      No results found for any visits on 08/03/23.  Recent Results (from the past 2160 hours)  CBC with Diff     Status: Abnormal   Collection Time: 06/25/23 11:21 AM  Result Value Ref Range   WBC 3.8 3.4 - 10.8 x10E3/uL   RBC 4.66 3.77 - 5.28 x10E6/uL   Hemoglobin 12.8 11.1 - 15.9 g/dL   Hematocrit 95.2 84.1 - 46.6 %   MCV 89 79 - 97 fL   MCH 27.5 26.6 - 33.0 pg   MCHC 31.0 (L) 31.5 - 35.7 g/dL   RDW 32.4 40.1 - 02.7 %   Platelets 241 150 - 450 x10E3/uL   Neutrophils 39 Not Estab. %   Lymphs 51 Not Estab. %   Monocytes 7 Not Estab. %   Eos 2 Not Estab. %   Basos 1 Not Estab. %   Neutrophils Absolute 1.5 1.4 - 7.0 x10E3/uL   Lymphocytes Absolute 2.0 0.7 - 3.1 x10E3/uL   Monocytes Absolute 0.3 0.1 - 0.9 x10E3/uL   EOS (ABSOLUTE) 0.1  0.0 - 0.4 x10E3/uL   Basophils Absolute 0.0 0.0 - 0.2 x10E3/uL   Immature Granulocytes 0 Not Estab. %   Immature Grans (Abs) 0.0 0.0 - 0.1 x10E3/uL  CMP14+EGFR     Status: Abnormal   Collection Time: 06/25/23 11:21 AM  Result Value Ref Range   Glucose 69 (L) 70 - 99 mg/dL   BUN 20 6 - 20 mg/dL   Creatinine, Ser 2.53 0.57 - 1.00 mg/dL   eGFR 82 >66 YQ/IHK/7.42   BUN/Creatinine Ratio 20 9 - 23   Sodium 139 134 - 144 mmol/L   Potassium 4.7 3.5 - 5.2 mmol/L   Chloride 100 96 - 106 mmol/L   CO2 25 20 - 29 mmol/L   Calcium 9.6 8.7 - 10.2 mg/dL   Total Protein 7.3 6.0 - 8.5 g/dL   Albumin 4.2 4.0 - 5.0 g/dL   Globulin, Total 3.1 1.5 - 4.5 g/dL   Bilirubin Total <5.9 0.0 - 1.2 mg/dL   Alkaline Phosphatase 61 44 - 121 IU/L   AST 19 0 - 40 IU/L   ALT 14 0 - 32 IU/L  Hemoglobin A1c     Status: Abnormal   Collection Time: 06/25/23 11:21 AM  Result Value Ref Range   Hgb A1c MFr Bld 5.8 (H) 4.8 - 5.6 %    Comment:          Prediabetes: 5.7 - 6.4          Diabetes: >6.4  Glycemic control for adults with diabetes: <7.0    Est. average glucose Bld gHb Est-mCnc 120 mg/dL  Sed Rate (ESR)     Status: None   Collection Time: 06/25/23 11:21 AM  Result Value Ref Range   Sed Rate 23 0 - 32 mm/hr      Assessment & Plan:  Patient will continue current management.  Will call her with results of the CT sinuses once available. Problem List Items Addressed This Visit     Prediabetes - Primary   Severe menstrual cramps   Sinus headache    Return in about 4 months (around 12/03/2023).   Total time spent: 30 minutes  Margaretann Loveless, MD  08/03/2023   This document may have been prepared by [facility] Voice Recognition software and as such may include unintentional dictation errors.

## 2023-08-14 ENCOUNTER — Other Ambulatory Visit: Payer: Self-pay | Admitting: Internal Medicine

## 2023-08-14 DIAGNOSIS — J341 Cyst and mucocele of nose and nasal sinus: Secondary | ICD-10-CM

## 2023-08-14 DIAGNOSIS — J342 Deviated nasal septum: Secondary | ICD-10-CM

## 2023-08-14 DIAGNOSIS — J329 Chronic sinusitis, unspecified: Secondary | ICD-10-CM

## 2023-08-22 ENCOUNTER — Encounter: Payer: Self-pay | Admitting: Certified Nurse Midwife

## 2023-08-22 ENCOUNTER — Other Ambulatory Visit (HOSPITAL_COMMUNITY)
Admission: RE | Admit: 2023-08-22 | Discharge: 2023-08-22 | Disposition: A | Discharge status: Home / Self Care | Source: Ambulatory Visit | Attending: Certified Nurse Midwife | Admitting: Certified Nurse Midwife

## 2023-08-22 ENCOUNTER — Ambulatory Visit (INDEPENDENT_AMBULATORY_CARE_PROVIDER_SITE_OTHER): Admitting: Certified Nurse Midwife

## 2023-08-22 VITALS — BP 119/80 | HR 76 | Ht 65.0 in | Wt 185.2 lb

## 2023-08-22 DIAGNOSIS — Z113 Encounter for screening for infections with a predominantly sexual mode of transmission: Secondary | ICD-10-CM | POA: Diagnosis present

## 2023-08-22 DIAGNOSIS — A630 Anogenital (venereal) warts: Secondary | ICD-10-CM

## 2023-08-22 DIAGNOSIS — Z01419 Encounter for gynecological examination (general) (routine) without abnormal findings: Secondary | ICD-10-CM

## 2023-08-22 DIAGNOSIS — Z124 Encounter for screening for malignant neoplasm of cervix: Secondary | ICD-10-CM | POA: Diagnosis present

## 2023-08-22 MED ORDER — IMIQUIMOD 5 % EX CREA
TOPICAL_CREAM | CUTANEOUS | 3 refills | Status: AC
Start: 2023-08-22 — End: ?

## 2023-08-22 NOTE — Patient Instructions (Signed)
 Birth Control Options Birth control is also called contraception. Birth control prevents pregnancy. There are many types of birth control. Work with your health care provider to find the best option for you. Birth control that uses hormones These types of birth control have hormones in them to prevent pregnancy. Birth control implant This is a small tube that is put into the skin of your arm. The tube can stay in for up to 3 years. Birth control shot These are shots you get every 3 months. Birth control pills This is a pill you take every day. You need to take it at the same time each day. Birth control patch This is a patch that you put on your skin. You change it 1 time each week for 3 weeks. After that, you take the patch off for 1 week. Vaginal ring  This is a soft plastic ring that you put in your vagina. The ring is left in for 3 weeks. Then, you take it out for 1 week. Then, you put a new ring in. Barrier methods  Female condom This is a thin covering that you put on the penis before sex. The condom is thrown away after sex. Female condom This is a soft, loose covering that you put in the vagina before sex. The condom is thrown away after sex. Diaphragm A diaphragm is a soft barrier that is shaped like a bowl. It must be made to fit your body. You put it in the vagina before sex with a chemical that kills sperm called spermicide. A diaphragm should be left in the vagina for 6-8 hours after sex and taken out within 24 hours. You need to replace a diaphragm: Every 1-2 years. After giving birth. After gaining more than 15 lb (6.8 kg). If you have surgery on your pelvis. Cervical cap This is a small, soft cup that fits over the cervix. The cervix is the lowest part of the uterus. It's put in the vagina before sex, along with spermicide. The cap must be made for you. The cap should be left in for 6-8 hours after sex. It is taken out within 48 hours. A cervical cap must be prescribed  and fit to your body by a provider. It should be replaced every 2 years. Sponge This is a small sponge that is put into the vagina before sex. It must be left in for at least 6 hours after sex. It must be taken out within 30 hours and thrown away. Spermicides These are chemicals that kill or stop sperm from getting into the uterus. They may be a pill, cream, jelly, or foam that you put into your vagina. They should be used at least 10-15 minutes before sex. Intrauterine device An intrauterine device (IUD) is a device that's put in the uterus by a provider. There are two types: Hormone IUD. This kind can stay in for 3-5 years. Copper IUD. This kind can stay in for 10 years. Permanent birth control Female tubal ligation This is surgery to block the fallopian tubes. Female sterilization This is a surgery, called a vasectomy, to tie off the tubes that carry sperm in men. This method takes 3 months to work. Other forms of birth control must be used for 3 months. Natural planning methods This means not having sex on the days the female partner could get pregnant. Here are some types of natural planning birth control: Using a calendar: To keep track of the length of each menstrual cycle. To find  out what days pregnancy can happen. To plan to not have sex on days when pregnancy can happen. Watching for signs of ovulation and not having sex during this time. The female partner can check for ovulation by keeping track of their temperature each day. They can also look for changes in the mucus that comes from the cervix. Where to find more information Centers for Disease Control and Prevention: TonerPromos.no. Then: Enter "birth control" in the search box. This information is not intended to replace advice given to you by your health care provider. Make sure you discuss any questions you have with your health care provider. Document Revised: 02/15/2023 Document Reviewed: 07/11/2022 Elsevier Patient Education   2024 [employer]. Preventive Care 67-4 Years Old, Female Preventive care refers to lifestyle choices and visits with your health care provider that can promote health and wellness. Preventive care visits are also called wellness exams. What can I expect for my preventive care visit? Counseling During your preventive care visit, your health care provider may ask about your: Medical history, including: Past medical problems. Family medical history. Pregnancy history. Current health, including: Menstrual cycle. Method of birth control. Emotional well-being. Home life and relationship well-being. Sexual activity and sexual health. Lifestyle, including: Alcohol, nicotine or tobacco, and drug use. Access to firearms. Diet, exercise, and sleep habits. Work and work Astronomer. Sunscreen use. Safety issues such as seatbelt and bike helmet use. Physical exam Your health care provider may check your: Height and weight. These may be used to calculate your BMI (body mass index). BMI is a measurement that tells if you are at a healthy weight. Waist circumference. This measures the distance around your waistline. This measurement also tells if you are at a healthy weight and may help predict your risk of certain diseases, such as type 2 diabetes and high blood pressure. Heart rate and blood pressure. Body temperature. Skin for abnormal spots. What immunizations do I need?  Vaccines are usually given at various ages, according to a schedule. Your health care provider will recommend vaccines for you based on your age, medical history, and lifestyle or other factors, such as travel or where you work. What tests do I need? Screening Your health care provider may recommend screening tests for certain conditions. This may include: Pelvic exam and Pap test. Lipid and cholesterol levels. Diabetes screening. This is done by checking your blood sugar (glucose) after you have not eaten for a while  (fasting). Hepatitis B test. Hepatitis C test. HIV (human immunodeficiency virus) test. STI (sexually transmitted infection) testing, if you are at risk. BRCA-related cancer screening. This may be done if you have a family history of breast, ovarian, tubal, or peritoneal cancers. Talk with your health care provider about your test results, treatment options, and if necessary, the need for more tests. Follow these instructions at home: Eating and drinking  Eat a healthy diet that includes fresh fruits and vegetables, whole grains, lean protein, and low-fat dairy products. Take vitamin and mineral supplements as recommended by your health care provider. Do not drink alcohol if: Your health care provider tells you not to drink. You are pregnant, may be pregnant, or are planning to become pregnant. If you drink alcohol: Limit how much you have to 0-1 drink a day. Know how much alcohol is in your drink. In the [place] one drink equals one 12 oz bottle of beer (355 mL), one 5 oz glass of wine (148 mL), or one 1 oz glass of hard liquor (44  mL). Lifestyle Brush your teeth every morning and night with fluoride toothpaste. Floss one time each day. Exercise for at least 30 minutes 5 or more days each week. Do not use any products that contain nicotine or tobacco. These products include cigarettes, chewing tobacco, and vaping devices, such as e-cigarettes. If you need help quitting, ask your health care provider. Do not use drugs. If you are sexually active, practice safe sex. Use a condom or other form of protection to prevent STIs. If you do not wish to become pregnant, use a form of birth control. If you plan to become pregnant, see your health care provider for a prepregnancy visit. Find healthy ways to manage stress, such as: Meditation, yoga, or listening to music. Journaling. Talking to a trusted person. Spending time with friends and family. Minimize exposure to UV radiation to reduce your  risk of skin cancer. Safety Always wear your seat belt while driving or riding in a vehicle. Do not drive: If you have been drinking alcohol. Do not ride with someone who has been drinking. If you have been using any mind-altering substances or drugs. While texting. When you are tired or distracted. Wear a helmet and other protective equipment during sports activities. If you have firearms in your house, make sure you follow all gun safety procedures. Seek help if you have been physically or sexually abused. What's next? Go to your health care provider once a year for an annual wellness visit. Ask your health care provider how often you should have your eyes and teeth checked. Stay up to date on all vaccines. This information is not intended to replace advice given to you by your health care provider. Make sure you discuss any questions you have with your health care provider. Document Revised: 11/10/2020 Document Reviewed: 11/10/2020 Elsevier Patient Education  2024 [employer].

## 2023-08-22 NOTE — Progress Notes (Signed)
 ANNUAL EXAM Patient name: Teresa Mcmillan MRN [id number]  Date of birth: 07/18/2000 Chief Complaint:   Gynecologic Exam  History of Present Illness:   Teresa Mcmillan is a 23 y.o. G0P0000 African-American female being seen today for a routine annual exam.  Current complaints: concern for possible genital warts, has used Aldara and been treated with TCA in the past. Notices occasional itching but not sure if it is related to warts, shaving or something else. Condoms for contraception. Did not start ibuprofen for dysmenorrhea prior to last cycle, has alarm set for this month & if it does not provide relief then she may desire hormonal method for managing pain.  Patient's last menstrual period was 08/17/2023.   Upstream - 08/22/23 1313       Pregnancy Intention Screening   Does the patient want to become pregnant in the next year? No    Does the patient's partner want to become pregnant in the next year? No    Would the patient like to discuss contraceptive options today? No      Contraception Wrap Up   Current Method Female Condom    End Method Female Condom    Contraception Counseling Provided Yes    How was the end contraceptive method provided? N/A            The pregnancy intention screening data noted above was reviewed. Potential methods of contraception were discussed. The patient elected to proceed with Female Condom.   No results found for: "DIAGPAP", "HPVHIGH", "ADEQPAP"     Last pap n/a. Results were: N/A. H/O abnormal pap: no Last mammogram: n/a. Results were: N/A. Family h/o breast cancer: yes Maternal aunt Last colonoscopy: n/a. Results were: N/A. Family h/o colorectal cancer: no     08/22/2023    1:14 PM 06/25/2023    3:39 PM  Depression screen PHQ 2/9  Decreased Interest 0 0  Down, Depressed, Hopeless 0 0  PHQ - 2 Score 0 0  Altered sleeping  0  Tired, decreased energy  0  Change in appetite  0  Feeling bad or failure about yourself   1  Trouble concentrating   1  Moving slowly or fidgety/restless  0  Suicidal thoughts  0  PHQ-9 Score  2  Difficult doing work/chores  Not difficult at all        06/25/2023    3:39 PM  GAD 7 : Generalized Anxiety Score  Nervous, Anxious, on Edge 1  Control/stop worrying 0  Worry too much - different things 0  Trouble relaxing 0  Restless 0  Easily annoyed or irritable 1  Afraid - awful might happen 1  Total GAD 7 Score 3   Past Medical History:  Diagnosis Date   Asthma    Family History  Problem Relation Age of Onset   Migraines Mother    ADD / ADHD Mother    Hypertension Father    Kidney disease Father    Breast cancer Maternal Aunt        unsure of age   Hypertension Maternal Grandfather    Lung cancer Maternal Grandfather    Diabetes Paternal Grandmother    Seizures Neg Hx    Autism Neg Hx    Anxiety disorder Neg Hx    Depression Neg Hx    Bipolar disorder Neg Hx    Schizophrenia Neg Hx      Review of Systems:   Pertinent items are noted in HPI Denies any headaches, blurred  vision, fatigue, shortness of breath, chest pain, abdominal pain, abnormal vaginal discharge/itching/odor/irritation, problems with periods, bowel movements, urination, or intercourse unless otherwise stated above. Pertinent History Reviewed:  Reviewed past medical,surgical, social and family history.  Reviewed problem list, medications and allergies. Physical Assessment:   Vitals:   08/22/23 0840  BP: 119/80  Pulse: 76  Weight: 185 lb 3.2 oz (84 kg)  Height: 5\' 5"  (1.651 m)  Body mass index is 30.82 kg/m.       Physical Exam Vitals reviewed.  Constitutional:      Appearance: Normal appearance.  HENT:     Head: Normocephalic.  Neck:     Thyroid: No thyroid mass or thyromegaly.  Cardiovascular:     Rate and Rhythm: Normal rate and regular rhythm.     Heart sounds: Normal heart sounds.  Pulmonary:     Effort: Pulmonary effort is normal.     Breath sounds: Normal breath sounds.  Chest:  Breasts:     Tanner Score is 5.     Right: Normal.     Left: Normal.  Abdominal:     General: Abdomen is flat.     Palpations: Abdomen is soft.     Tenderness: There is no abdominal tenderness.  Genitourinary:    General: Normal vulva.     Tanner stage (genital): 5.     Vagina: Normal.     Cervix: Normal.     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 Musculoskeletal:     Cervical back: Neck supple. No tenderness.  Skin:    General: Skin is warm and dry.  Neurological:     General: No focal deficit present.     Mental Status: She is alert and oriented to person, place, and time.  Psychiatric:        Mood and Affect: Mood normal.        Behavior: Behavior normal.      No results found for this or any previous visit (from the past 24 hours).  Assessment & Plan:  1. Well woman exam (Primary) - Cytology - PAP  2. Cervical cancer screening - Cytology - PAP  3. Screen for sexually transmitted diseases - Cytology - PAP  4. Genital warts - imiquimod (ALDARA) 5 % cream; Apply topically 3 (three) times a week. Apply a thin layer 3 times per week (on alternate days) prior to bedtime; leave on skin for 6 to 10 hours, then remove with mild soap and water. Continue until there is total clearance of the genital/perianal warts or for a maximum duration of therapy of 16 weeks  Dispense: 24 each; Refill: 3 Will try aldara again, if no success may repeat TCA   Mammogram: @ 22yo, or sooner if problems Colonoscopy: @ 22yo, or sooner if problems  No orders of the defined types were placed in this encounter.   Meds:  Meds ordered this encounter  Medications   imiquimod (ALDARA) 5 % cream    Sig: Apply topically 3 (three) times a week. Apply a thin layer 3 times per week (on alternate days) prior to bedtime; leave on skin for 6 to 10 hours, then remove with mild soap and water. Continue until there is total clearance of the genital/perianal warts or for a maximum duration of therapy  of 16 weeks    Dispense:  24 each    Refill:  3    Follow-up: Return in 1 year (on 08/21/2024) for Annual exam.  Dominica Severin, CNM 08/22/2023 1:15  PM

## 2023-08-23 LAB — CYTOLOGY - PAP
Chlamydia: NEGATIVE
Comment: NEGATIVE
Comment: NEGATIVE
Comment: NORMAL
Diagnosis: NEGATIVE
Diagnosis: REACTIVE
Neisseria Gonorrhea: NEGATIVE
Trichomonas: NEGATIVE

## 2023-08-27 ENCOUNTER — Encounter: Payer: Self-pay | Admitting: Certified Nurse Midwife

## 2023-11-22 ENCOUNTER — Encounter: Payer: Self-pay | Admitting: Nurse Practitioner

## 2023-11-22 ENCOUNTER — Ambulatory Visit: Admitting: Nurse Practitioner

## 2023-11-22 VITALS — BP 111/70 | HR 91 | Ht 65.0 in | Wt 193.6 lb

## 2023-11-22 DIAGNOSIS — Z8619 Personal history of other infectious and parasitic diseases: Secondary | ICD-10-CM

## 2023-11-22 DIAGNOSIS — Z3009 Encounter for other general counseling and advice on contraception: Secondary | ICD-10-CM

## 2023-11-22 DIAGNOSIS — Z309 Encounter for contraceptive management, unspecified: Secondary | ICD-10-CM | POA: Diagnosis not present

## 2023-11-22 MED ORDER — NORGESTIMATE-ETH ESTRADIOL 0.25-35 MG-MCG PO TABS
1.0000 | ORAL_TABLET | Freq: Every day | ORAL | 11 refills | Status: AC
Start: 2023-11-22 — End: ?

## 2023-11-22 NOTE — Progress Notes (Signed)
 Patient is here for family planning visit. Order for OCP pills sent to patient's pharmacy. Family planning education card given to patient. All questions answered and verbalizes understanding.   Doyce CINDERELLA Shuck, RN

## 2023-11-22 NOTE — Progress Notes (Signed)
 [facility] [address], [address] [place] [address] Main phone: [phone]  Family Planning Visit - Repeat Yearly Visit  Subjective:  Teresa Mcmillan is a 23 y.o. G0P0000  being seen today for an annual wellness visit and to discuss contraception options. The patient is currently using female condom for pregnancy prevention. Patient does not want a pregnancy in the next year.   Patient reports they are looking for a method with the following characteristics:  Cycle control Method they can control starting and stopping  Patient has the following medical problems:  Patient Active Problem List   Diagnosis Date Noted   Prediabetes 06/25/2023   Severe menstrual cramps 06/25/2023   Sinus headache 06/25/2023   Vaginal discharge 12/07/2020   Tension headache 08/07/2017   Migraine without aura and without status migrainosus, not intractable 08/07/2017   Secondary amenorrhea 07/31/2017   Asthma 11/12/2012   Chief Complaint  Patient presents with   Acute Visit    Pt is here for [facility]     HPI Patient reports desire to try contraceptive pills. Has been on Depo years, but had 2 injections and didn't have any menstrual bleeding for 2 whole years which concerned her. Tried the mini pill years ago in high school and had headaches. No longer has any headaches other than occasional sinus headache upon wakening. No history of migraines. Was recently prescribed Flonase  to help w/ sinus issues.  Then tried the contraceptive patch and liked it, other than difficulty with it falling off. No headaches w/ patch or other bothersome side effects. She then tried Nexplanon , but would prefer a method she can stop at any time.  Patient denies history of HTN, blood clots, breast cancer, or migraine headaches. No contraindications to estrogen.  Recently had physical exam and STI screening, pap at [facility]. Pap was normal.  HX genital warts  of vulva, requested exam today and possible treatment.   See flowsheet for further details and programmatic requirements Hyperlink available at the top of the signed note in blue.  Flow sheet content below:  Pregnancy Intention Screening Does the patient want to become pregnant in the next year?: No Does the patient's partner want to become pregnant in the next year?: No Would the patient like to discuss contraceptive options today?: Yes Results Follow up Password: pink Contraception History Past methods of contraception used by patient:: Hormonal Implant Adverse effects associated with Hormonal Implant: none Sexual History What age did you start your period?: 12 How often do you have your period?: monthly Date of last sex?: 11/17/23 Has the patient had unprotected sex within the last 5 days?: No Do you have sex with men, women, both men and women?: Men only In the past 2 months how many partners have you had sex with?: 1 In the past 12 months, how many partners have you had sex with?: 1 Is it possible that any of your sex partners in the past 12 months had sex with someone else whild they were still in a sexual relationship with you?: No What ways do you have sex?: Vaginal Do you or your partner use condoms and/or dental dams every time you have vaginal, oral or anal sex?: Sometimes Do you douche?: No Have you ever had an STD?: No Have any of your partners had an STD?: No Have you or your partner ever shot up drugs?: No Have any of your partners used drugs in the past?: No Have you or  your partners exchanged money or drugs for sex?: No  Diabetes screening This patient is 23 y.o. with a BMI of Body mass index is 32.22 kg/m.SABRA  Is patient eligible for diabetes screening (age >35 and BMI >25)?  no  Was Hgb A1c ordered? not applicable  STI screening Patient reports 1 of partners in last year.  Does this patient desire STI screening?  No - recently tested negative for  gonorrhea/chlamydia and had WNL pap. Declined repeat testing, bloodwork today.  Hepatitis C screening Has patient been screened once for HCV in the past?  No  No results found for: HCVAB  Does the patient meet criteria for HCV testing? Did not screen d/t patient not wanting any bloodwork today (If yes-- Screen for HCV through [facility]) Criteria:  Since the last HCV result, does the patient have any of the following? - Current drug use - Have a partner with drug use - Has been incarcerated  Hepatitis B screening Does the patient meet criteria for HBV testing? Screening not completed, patient declined bloodwork today Criteria:  -Household, sexual or needle sharing contact with HBV -History of drug use -HIV positive -Those with known Hep C  Cervical Cancer Screening  Result Date Procedure Results Follow-ups  08/22/2023 Cytology - PAP Neisseria Gonorrhea: Negative Chlamydia: Negative Trichomonas: Negative Adequacy: Satisfactory for evaluation; transformation zone component PRESENT. Diagnosis: - Negative for Intraepithelial Lesions or Malignancy (NILM) Diagnosis: - Benign reactive/reparative changes Comment: Normal Reference Range Trichomonas - Negative Comment: Normal Reference Ranger Chlamydia - Negative Comment: Normal Reference Range Neisseria Gonorrhea - Negative     Health Maintenance Due  Topic Date Due   HPV VACCINES (1 - 3-dose series) Never done   HIV Screening  Never done   Meningococcal B Vaccine (1 of 2 - Standard) Never done   Hepatitis C Screening  Never done   DTaP/Tdap/Td (1 - Tdap) Never done   Pneumococcal Vaccine 50-49 Years old (1 of 2 - PCV) Never done   Hepatitis B Vaccines (1 of 3 - 19+ 3-dose series) Never done   COVID-19 Vaccine (1 - 2024-25 season) Never done    The following portions of the patient's history were reviewed and updated as appropriate: allergies, current medications, past family history, past medical history, past social  history, past surgical history and problem list. Problem list updated.  Objective:   Vitals:   11/22/23 1050  BP: 111/70  Pulse: 91  Weight: 193 lb 9.6 oz (87.8 kg)  Height: 5' 5 (1.651 m)    Physical Exam Nursing note reviewed. Exam conducted with a chaperone present Susette Satchel [facility] and Saul Ina PA PA).  Constitutional:      Appearance: Normal appearance.  HENT:     Head: Normocephalic.     Salivary Glands: Right salivary gland is not diffusely enlarged or tender. Left salivary gland is not diffusely enlarged or tender.     Mouth/Throat:     Lips: Pink. No lesions.     Mouth: Mucous membranes are moist.     Tongue: No lesions. Tongue does not deviate from midline.     Pharynx: Oropharynx is clear. Uvula midline. No oropharyngeal exudate or posterior oropharyngeal erythema.     Tonsils: No tonsillar exudate.   Eyes:     General:        Right eye: No discharge.        Left eye: No discharge.    Cardiovascular:     Rate and Rhythm: Normal rate and regular  rhythm.  Pulmonary:     Effort: Pulmonary effort is normal.     Breath sounds: Normal breath sounds.  Genitourinary:    Labia:        Right: No lesion.        Left: No lesion.      Comments: Vulva examined for any signs of genital warts. None seen, normal vulvar tissue. Lymphadenopathy:     Head:     Right side of head: No submental, submandibular, tonsillar, preauricular or posterior auricular adenopathy.     Left side of head: No submental, submandibular, tonsillar, preauricular or posterior auricular adenopathy.     Cervical: No cervical adenopathy.     Right cervical: No superficial or posterior cervical adenopathy.    Left cervical: No superficial or posterior cervical adenopathy.     Upper Body:     Right upper body: No supraclavicular or axillary adenopathy.     Left upper body: No supraclavicular or axillary adenopathy.   Skin:    General: Skin is warm and dry.     Comments: Skin tone  appropriate for ethnicity. Assessed exposed areas only and back.    Neurological:     Mental Status: She is alert and oriented to person, place, and time.   Psychiatric:        Attention and Perception: Attention and perception normal.        Mood and Affect: Mood and affect normal.        Speech: Speech normal.        Behavior: Behavior normal. Behavior is cooperative.        Thought Content: Thought content normal.     Assessment and Plan:  KAILENE STEINHART is a 23 y.o. female G0P0000 presenting to the [facility] Department for an yearly wellness and contraception visit   1. Family planning (Primary) Contraception counseling:  Reviewed options based on patient desire and reproductive life plan. Patient is interested in Oral Contraceptive. This was provided to the patient today.   Risks, benefits, and typical effectiveness rates were reviewed.  Questions were answered.  Written information was also given to the patient to review.    The patient will follow up in  1 years for surveillance.  The patient was told to call with any further questions, or with any concerns about this method of contraception.  Emphasized use of condoms 100% of the time for STI prevention.  Emergency Contraception Precautions (ECP): Patient assessed for need of ECP. She is not a candidate based on had protected intercourse 2 days before her period started on 11/19/23. Educated on ECP and reviewed options.  Patient desires no method - patient politely declines any emergency contraception.  - norgestimate-ethinyl estradiol  (ORTHO-CYCLEN) 0.25-35 MG-MCG tablet; Take 1 tablet by mouth daily.  Dispense: 28 tablet; Refill: 11 -Discussed taking pill at same time daily, side effects, use of condoms for STI prevention  2. History of genital warts - Exam today without any warts evident, no treatment warranted   Return in about 1 year (around 11/21/2024), or if symptoms worsen or fail to improve, for annual  well-woman exam.  Future Appointments  Date Time Provider Department Center  12/17/2023  1:00 PM Fernand Fredy RAMAN, MD AMA-AMA None    Damien Satchel [facility] Clarita Narrow FNP, Supervised Damien HUNT with documentation and counseling alongside Annamarie Streilein, PA.

## 2023-12-17 ENCOUNTER — Ambulatory Visit: Admitting: Internal Medicine

## 2024-05-16 ENCOUNTER — Encounter (HOSPITAL_COMMUNITY): Payer: Self-pay

## 2024-05-16 ENCOUNTER — Ambulatory Visit (HOSPITAL_COMMUNITY)
Admission: EM | Admit: 2024-05-16 | Discharge: 2024-05-16 | Disposition: A | Discharge status: Home / Self Care | Attending: Family Medicine | Admitting: Family Medicine

## 2024-05-16 DIAGNOSIS — W540XXA Bitten by dog, initial encounter: Secondary | ICD-10-CM

## 2024-05-16 DIAGNOSIS — N926 Irregular menstruation, unspecified: Secondary | ICD-10-CM

## 2024-05-16 DIAGNOSIS — S80811A Abrasion, right lower leg, initial encounter: Secondary | ICD-10-CM | POA: Diagnosis not present

## 2024-05-16 DIAGNOSIS — M79604 Pain in right leg: Secondary | ICD-10-CM | POA: Diagnosis not present

## 2024-05-16 LAB — POCT URINE PREGNANCY: Preg Test, Ur: POSITIVE — AB

## 2024-05-16 MED ORDER — CLINDAMYCIN HCL 300 MG PO CAPS: 300.0000 mg | ORAL_CAPSULE | Freq: Three times a day (TID) | ORAL | 0 refills | Status: AC

## 2024-05-16 NOTE — Discharge Instructions (Addendum)
 Your pregnancy test was positive  Please follow-up with your primary care and with OB/GYN  Wash the wound 2 times daily with soapy water and then put new triple antibiotic ointment on it and a new Band-Aid.  You may want to ice the area tonight and tomorrow  --Take clindamycin 300 mg-- 1 capsule 3 times daily for 5 days

## 2024-05-16 NOTE — ED Triage Notes (Signed)
 Patient was bitten by a dog on the right lower thigh above the knee. Patient states it ws her cousin's friend and it ws reported that the dog was vaccinated.

## 2024-05-16 NOTE — ED Provider Notes (Signed)
 " [facility]    CSN: [id number] Arrival date & time: 05/16/24  1924      History   Chief Complaint Chief Complaint  Patient presents with   Animal Bite    HPI ANNDREA Mcmillan is a 23 y.o. female.    Animal Bite  Here for pain and bite on her right leg that occurred about 4 hours ago.  Tonight she was in the dark and walked up to her cousin's friend's dog and the dog was surprised and bit her on the right leg.  This was through her jeans.  The dog is vaccinated Patient's last tetanus was about 2022.  It hurts some around the abrasion that is causing her right lateral leg near the knee.  She is allergic to penicillin and amoxicillin  Last menstrual cycle began November 14. She did take a Plan B about a week or 2 ago.  She has not had any congestion or cough or sore throat or headache or nausea or vomiting or diarrhea or dysuria.  No abdominal pain  Past Medical History:  Diagnosis Date   Asthma     Patient Active Problem List   Diagnosis Date Noted   Prediabetes 06/25/2023   Severe menstrual cramps 06/25/2023   Sinus headache 06/25/2023   Vaginal discharge 12/07/2020   Tension headache 08/07/2017   Migraine without aura and without status migrainosus, not intractable 08/07/2017   Secondary amenorrhea 07/31/2017   Asthma 11/12/2012    Past Surgical History:  Procedure Laterality Date   bands     Mom reports constrictive bands on her legs and finger when she was 18 months.   club feet      OB History     Gravida  0   Para  0   Term  0   Preterm  0   AB  0   Living  0      SAB  0   IAB  0   Ectopic  0   Multiple  0   Live Births  0            Home Medications    Prior to Admission medications  Medication Sig Start Date End Date Taking? Authorizing Provider  clindamycin (CLEOCIN) 300 MG capsule Take 1 capsule (300 mg total) by mouth 3 (three) times daily for 5 days. 05/16/24 05/21/24 Yes BanisterSharlet POUR, MD   albuterol  (VENTOLIN  HFA) 108 [phone] Base) MCG/ACT inhaler Inhale 2 puffs into the lungs every 6 (six) hours as needed for wheezing or shortness of breath. Patient not taking: Reported on 08/03/2023 08/01/23   Jayne Harlene LITTIE, CNM  fluticasone  (FLONASE ) 50 MCG/ACT nasal spray Place 1 spray into both nostrils daily. 06/25/23   Fernand Fredy RAMAN, MD  imiquimod  (ALDARA ) 5 % cream Apply topically 3 (three) times a week. Apply a thin layer 3 times per week (on alternate days) prior to bedtime; leave on skin for 6 to 10 hours, then remove with mild soap and water. Continue until there is total clearance of the genital/perianal warts or for a maximum duration of therapy of 16 weeks 08/22/23   Jayne Harlene LITTIE, CNM  Loratadine  10 MG CAPS Take 1 capsule (10 mg total) by mouth daily. Patient not taking: Reported on 05/16/2024 06/25/23   Fernand Fredy RAMAN, MD  mupirocin ointment (BACTROBAN) 2 % Apply topically 2 (two) times daily. Patient not taking: Reported on 06/25/2023    [provider]  norgestimate -ethinyl estradiol  (ORTHO-CYCLEN) 0.25-35  MG-MCG tablet Take 1 tablet by mouth daily. 11/22/23   Birdena Clarita CROME, NP    Family History Family History  Problem Relation Age of Onset   Migraines Mother    ADD / ADHD Mother    Hypertension Father    Kidney disease Father    Hypertension Maternal Grandfather    Lung cancer Maternal Grandfather    Diabetes Paternal Grandmother    Breast cancer Maternal Aunt        unsure of age   Seizures Neg Hx    Autism Neg Hx    Anxiety disorder Neg Hx    Depression Neg Hx    Bipolar disorder Neg Hx    Schizophrenia Neg Hx     Social History Social History[1]   Allergies   Amoxicillin, Penicillins, and Shellfish allergy   Review of Systems Review of Systems   Physical Exam Triage Vital Signs ED Triage Vitals  Encounter Vitals Group     BP 05/16/24 2000 111/65     Girls Systolic BP Percentile --      Girls Diastolic BP Percentile --      Boys  Systolic BP Percentile --      Boys Diastolic BP Percentile --      Pulse Rate 05/16/24 2000 (!) 102     Resp 05/16/24 2000 14     Temp 05/16/24 2000 100 F (37.8 C)     Temp Source 05/16/24 2000 Oral     SpO2 05/16/24 2000 95 %     Weight --      Height --      Head Circumference --      Peak Flow --      Pain Score 05/16/24 2003 7     Pain Loc --      Pain Education --      Exclude from Growth Chart --    No data found.  Updated Vital Signs BP 111/65 (BP Location: Right Arm)   Pulse (!) 102   Temp 100 F (37.8 C) (Oral)   Resp 14   LMP 04/13/2024 (Approximate)   SpO2 95%   Visual Acuity Right Eye Distance:   Left Eye Distance:   Bilateral Distance:    Right Eye Near:   Left Eye Near:    Bilateral Near:     Physical Exam Vitals reviewed.  Constitutional:      General: She is not in acute distress.    Appearance: She is not ill-appearing, toxic-appearing or diaphoretic.  Skin:    Coloration: Skin is not jaundiced or pale.     Comments: There is a shallow circular abrasion about 1 cm in diameter  lateral to the right knee.  There is a little tenderness and bruising around it.  No induration and no drainage.  Neurological:     Mental Status: She is alert.      UC Treatments / Results  Labs (all labs ordered are listed, but only abnormal results are displayed) Labs Reviewed  POCT URINE PREGNANCY - Abnormal; Notable for the following components:      Result Value   Preg Test, Ur Positive (*)    All other components within normal limits    EKG   Radiology No results found.  Procedures Procedures (including critical care time)  Medications Ordered in UC Medications - No data to display  Initial Impression / Assessment and Plan / UC Course  I have reviewed the triage vital signs and the nursing notes.  Pertinent labs & imaging results that were available during my care of the patient were reviewed by me and considered in my medical decision making  (see chart for details).      Her temp is 100.0.  She has no symptoms referable to this.  She will check her temperature in the next couple days occasionally if needed or return to be seen if she develops other symptoms.  The wound is cleansed with Hibiclens and bacitracin  and a Band-Aid are applied.  Wound care is explained  UPT is positive. Only clindamycin is therefore sent in.  Of note, the dog's tooth did tear her jeans and it is his tooth that tore the skin. Final Clinical Impressions(s) / UC Diagnoses   Final diagnoses:  Right leg pain  Abrasion of right lower extremity, initial encounter  Dog bite, initial encounter  Irregular menses     Discharge Instructions      Your pregnancy test was positive  Please follow-up with your primary care and with OB/GYN  Wash the wound 2 times daily with soapy water and then put new triple antibiotic ointment on it and a new Band-Aid.  You may want to ice the area tonight and tomorrow  --Take clindamycin 300 mg-- 1 capsule 3 times daily for 5 days      ED Prescriptions     Medication Sig Dispense Auth. Provider   clindamycin (CLEOCIN) 300 MG capsule Take 1 capsule (300 mg total) by mouth 3 (three) times daily for 5 days. 15 capsule Vonna Darran Gabay K, MD      PDMP not reviewed this encounter.     [1]  Social History Tobacco Use   Smoking status: Never   Smokeless tobacco: Never  Vaping Use   Vaping status: Never Used  Substance Use Topics   Alcohol use: Yes    Alcohol/week: 1.0 standard drink of alcohol    Types: 1 Glasses of wine per week    Comment: occassionally   Drug use: No     Vonna Sharlet POUR, MD 05/16/24 2039  "

## 2024-05-23 ENCOUNTER — Ambulatory Visit: Admitting: Internal Medicine

## 2024-05-23 ENCOUNTER — Encounter: Payer: Self-pay | Admitting: Internal Medicine

## 2024-05-23 VITALS — BP 118/76 | HR 80 | Ht 65.0 in | Wt 210.4 lb

## 2024-05-23 DIAGNOSIS — Z0130 Encounter for examination of blood pressure without abnormal findings: Secondary | ICD-10-CM

## 2024-05-23 DIAGNOSIS — K5904 Chronic idiopathic constipation: Secondary | ICD-10-CM | POA: Insufficient documentation

## 2024-05-23 DIAGNOSIS — Z131 Encounter for screening for diabetes mellitus: Secondary | ICD-10-CM

## 2024-05-23 NOTE — Progress Notes (Signed)
 "  Established Patient Office Visit  Subjective:  Patient ID: Teresa Mcmillan, female    DOB: 2000-06-07  Age: 23 y.o. MRN: [id number]  Chief Complaint  Patient presents with   Constipation    Worse for the last month    Patient comes in with reports of constipation, getting worse.  She has history of mild constipation off-and-on and usually goes to have a bowel movement every 4 to 5 days.  But thinks it is getting worse now and she feels there is pressure and weight in the lower part of her rectum.  She started taking stool softeners and also took 1 suppository with some results but still feels backed up.  She did not have any bleeding or has history of hemorrhoids yet.  Denies nausea or vomiting, no abdominal pain and no urinary problems. Patient recently found out she is pregnant on 05/16/2024.  Reports that this was a surprise and unplanned and she is not ready at this time.  [facility] Meanwhile patient advised to increase her water intake, increase fiber in her diet.  Can take a stool softener if no BM for 3 days.  May use a suppository again, may repeat once more if no results. Patient will return for follow-up and will decide if she needs a GI referral.    No other concerns at this time.   Past Medical History:  Diagnosis Date   Asthma     Past Surgical History:  Procedure Laterality Date   bands     Mom reports constrictive bands on her legs and finger when she was 18 months.   club feet      Social History   Socioeconomic History   Marital status: Single    Spouse name: Not on file   Number of children: Not on file   Years of education: Not on file   Highest education level: Not on file  Occupational History   Not on file  Tobacco Use   Smoking status: Never   Smokeless tobacco: Never  Vaping Use   Vaping status: Never Used  Substance and Sexual Activity   Alcohol use: Yes    Alcohol/week: 1.0 standard drink of alcohol     Types: 1 Glasses of wine per week    Comment: occassionally   Drug use: No   Sexual activity: Yes    Partners: Male    Birth control/protection: Condom  Other Topics Concern   Not on file  Social History Narrative   Patient lives at home with mom and granmother. She is in the 9th grade at [employer]. She does well in school. She enjoys math, volleyball and swimming   Social Drivers of Health   Tobacco Use: Low Risk (05/23/2024)   Patient History    Smoking Tobacco Use: Never    Smokeless Tobacco Use: Never    Passive Exposure: Not on file  Financial Resource Strain: Low Risk  (12/11/2021)   [facility]   Overall Financial Resource Strain (CARDIA)    Difficulty of Paying Living Expenses: Not hard at all  Food Insecurity: No Food Insecurity (12/11/2021)   Received from [facility]   Epic    Within the past 12 months, you worried that your food would run out before you got the money to buy more.: Never true    Within the past 12 months, the food you bought just didn't last and you didn't  have money to get more.: Never true  Transportation Needs: No Transportation Needs (12/11/2021)   Received from [facility] - Transportation    In the past 12 months, has lack of transportation kept you from medical appointments or from getting medications?: No    Lack of Transportation (Non-Medical): No  Physical Activity: Not on file  Stress: Not on file  Social Connections: Not on file  Intimate Partner Violence: Not At Risk (11/22/2023)   Epic    Fear of Current or Ex-Partner: No    Emotionally Abused: No    Physically Abused: No    Sexually Abused: No  Depression (PHQ2-9): Low Risk (11/22/2023)   Depression (PHQ2-9)    PHQ-2 Score: 0  Alcohol Screen: Not on file  Housing: Not on file  Utilities: Not on file  Health Literacy: Not on file    Family History  Problem Relation Age of Onset   Migraines Mother     ADD / ADHD Mother    Hypertension Father    Kidney disease Father    Hypertension Maternal Grandfather    Lung cancer Maternal Grandfather    Diabetes Paternal Grandmother    Breast cancer Maternal Aunt        unsure of age   Seizures Neg Hx    Autism Neg Hx    Anxiety disorder Neg Hx    Depression Neg Hx    Bipolar disorder Neg Hx    Schizophrenia Neg Hx     Allergies[1]  Show/hide medication list[2]  Review of Systems  Constitutional: Negative.  Negative for chills, fever and malaise/fatigue.  HENT: Negative.  Negative for congestion and sore throat.   Eyes: Negative.  Negative for blurred vision and pain.  Respiratory: Negative.  Negative for cough and shortness of breath.   Cardiovascular: Negative.  Negative for chest pain, palpitations and leg swelling.  Gastrointestinal:  Positive for constipation. Negative for abdominal pain, blood in stool, diarrhea, heartburn, melena, nausea and vomiting.  Genitourinary: Negative.  Negative for dysuria, flank pain, frequency and urgency.  Musculoskeletal: Negative.  Negative for joint pain and myalgias.  Skin: Negative.   Neurological: Negative.  Negative for dizziness, tingling, sensory change, weakness and headaches.  Endo/Heme/Allergies: Negative.   Psychiatric/Behavioral: Negative.  Negative for depression and suicidal ideas. The patient is not nervous/anxious.        Objective:   BP 118/76   Pulse 80   Ht 5' 5 (1.651 m)   Wt 210 lb 6.4 oz (95.4 kg)   LMP 04/13/2024 (Approximate)   SpO2 94%   BMI 35.01 kg/m   Vitals:   05/23/24 1115  BP: 118/76  Pulse: 80  Height: 5' 5 (1.651 m)  Weight: 210 lb 6.4 oz (95.4 kg)  SpO2: 94%  BMI (Calculated): 35.01    Physical Exam Vitals and nursing note reviewed.  Constitutional:      Appearance: Normal appearance.  HENT:     Head: Normocephalic and atraumatic.     Nose: Nose normal.     Mouth/Throat:     Mouth: Mucous membranes are moist.     Pharynx: Oropharynx  is clear.  Eyes:     Conjunctiva/sclera: Conjunctivae normal.     Pupils: Pupils are equal, round, and reactive to light.  Cardiovascular:     Rate and Rhythm: Normal rate and regular rhythm.     Pulses: Normal pulses.     Heart sounds: Normal heart sounds. No murmur heard. Pulmonary:  Effort: Pulmonary effort is normal.     Breath sounds: Normal breath sounds. No wheezing.  Abdominal:     General: Bowel sounds are normal.     Palpations: Abdomen is soft.     Tenderness: There is no abdominal tenderness. There is no right CVA tenderness or left CVA tenderness.  Musculoskeletal:        General: Normal range of motion.     Cervical back: Normal range of motion.     Right lower leg: No edema.     Left lower leg: No edema.  Skin:    General: Skin is warm and dry.  Neurological:     General: No focal deficit present.     Mental Status: She is alert and oriented to person, place, and time.  Psychiatric:        Mood and Affect: Mood normal.        Behavior: Behavior normal.      No results found for any visits on 05/23/24.  Recent Results (from the past 2160 hours)  POCT urine pregnancy     Status: Abnormal   Collection Time: 05/16/24  8:29 PM  Result Value Ref Range   Preg Test, Ur Positive (A) Negative      Assessment & Plan:  Manage constipation as discussed. Patient will return if not better otherwise follow-up in 6 weeks. Problem List Items Addressed This Visit       Digestive   Chronic idiopathic constipation - Primary    Return in about 3 weeks (around 06/13/2024).   Total time spent: 25 minutes. This time includes review of previous notes and results and patient face to face interaction during today's visit.    FERNAND FREDY RAMAN, MD  05/23/2024   This document may have been prepared by [facility] Voice Recognition software and as such may include unintentional dictation errors.     [1]  Allergies Allergen Reactions   Amoxicillin Hives   Penicillins     Shellfish Allergy   [2]  Outpatient Medications Prior to Visit  Medication Sig   fluticasone  (FLONASE ) 50 MCG/ACT nasal spray Place 1 spray into both nostrils daily.   imiquimod  (ALDARA ) 5 % cream Apply topically 3 (three) times a week. Apply a thin layer 3 times per week (on alternate days) prior to bedtime; leave on skin for 6 to 10 hours, then remove with mild soap and water. Continue until there is total clearance of the genital/perianal warts or for a maximum duration of therapy of 16 weeks   albuterol  (VENTOLIN  HFA) 108 (90 Base) MCG/ACT inhaler Inhale 2 puffs into the lungs every 6 (six) hours as needed for wheezing or shortness of breath. (Patient not taking: Reported on 05/23/2024)   Loratadine  10 MG CAPS Take 1 capsule (10 mg total) by mouth daily. (Patient not taking: Reported on 05/23/2024)   mupirocin ointment (BACTROBAN) 2 % Apply topically 2 (two) times daily. (Patient not taking: Reported on 05/23/2024)   norgestimate -ethinyl estradiol  (ORTHO-CYCLEN) 0.25-35 MG-MCG tablet Take 1 tablet by mouth daily. (Patient not taking: Reported on 05/23/2024)   No facility-administered medications prior to visit.   "

## 2024-05-29 ENCOUNTER — Other Ambulatory Visit: Payer: Self-pay

## 2024-05-29 ENCOUNTER — Encounter: Payer: Self-pay | Admitting: Emergency Medicine

## 2024-05-29 ENCOUNTER — Emergency Department
Admission: EM | Admit: 2024-05-29 | Discharge: 2024-05-30 | Disposition: A | Discharge status: Home / Self Care | Attending: Emergency Medicine | Admitting: Emergency Medicine

## 2024-05-29 DIAGNOSIS — O26891 Other specified pregnancy related conditions, first trimester: Secondary | ICD-10-CM | POA: Diagnosis present

## 2024-05-29 DIAGNOSIS — O4690 Antepartum hemorrhage, unspecified, unspecified trimester: Secondary | ICD-10-CM

## 2024-05-29 DIAGNOSIS — O418X10 Other specified disorders of amniotic fluid and membranes, first trimester, not applicable or unspecified: Secondary | ICD-10-CM | POA: Diagnosis not present

## 2024-05-29 DIAGNOSIS — Z3A01 Less than 8 weeks gestation of pregnancy: Secondary | ICD-10-CM | POA: Diagnosis not present

## 2024-05-29 LAB — CBC WITH DIFFERENTIAL/PLATELET
Abs Immature Granulocytes: 0.02 K/uL (ref 0.00–0.07)
Basophils Absolute: 0 K/uL (ref 0.0–0.1)
Basophils Relative: 1 %
Eosinophils Absolute: 0.2 K/uL (ref 0.0–0.5)
Eosinophils Relative: 4 %
HCT: 40.2 % (ref 36.0–46.0)
Hemoglobin: 12.7 g/dL (ref 12.0–15.0)
Immature Granulocytes: 0 %
Lymphocytes Relative: 38 %
Lymphs Abs: 2.2 K/uL (ref 0.7–4.0)
MCH: 27.5 pg (ref 26.0–34.0)
MCHC: 31.6 g/dL (ref 30.0–36.0)
MCV: 87.2 fL (ref 80.0–100.0)
Monocytes Absolute: 0.4 K/uL (ref 0.1–1.0)
Monocytes Relative: 7 %
Neutro Abs: 3 K/uL (ref 1.7–7.7)
Neutrophils Relative %: 50 %
Platelets: 256 K/uL (ref 150–400)
RBC: 4.61 MIL/uL (ref 3.87–5.11)
RDW: 13.7 % (ref 11.5–15.5)
WBC: 5.9 K/uL (ref 4.0–10.5)
nRBC: 0 % (ref 0.0–0.2)

## 2024-05-29 MED ORDER — ONDANSETRON 4 MG PO TBDP
4.0000 mg | ORAL_TABLET | Freq: Once | ORAL | Status: AC
  Administered 2024-05-29: 4 mg via ORAL
  Filled 2024-05-29: qty 1

## 2024-05-29 MED ORDER — ACETAMINOPHEN 500 MG PO TABS
1000.0000 mg | ORAL_TABLET | Freq: Once | ORAL | Status: AC
  Administered 2024-05-29: 1000 mg via ORAL
  Filled 2024-05-29: qty 2

## 2024-05-29 NOTE — ED Provider Notes (Signed)
 "  [facility] Provider Note    Event Date/Time   First MD Initiated Contact with Patient 05/29/24 2151     (approximate)   History   Chief Complaint Nausea, Abdominal Cramping, and Vaginal Bleeding (Spotting )   HPI  Teresa Mcmillan is a 24 y.o. female G1P0 at approximately 7 weeks of pregnancy who presents to the ED complaining of abdominal pain and vaginal bleeding.  Patient reports that yesterday she noticed that she had some light vaginal bleeding, which she describes as spotting.  This been associated with crampy pain in the left lower quadrant of her abdomen.  She also complains of some nausea but has not had any vomiting and denies any changes in her bowel movements.  She denies any fevers, dysuria, or flank pain.  She has not had an appointment with OB/GYN this pregnancy and has not yet had an ultrasound.     Physical Exam   Triage Vital Signs: ED Triage Vitals  Encounter Vitals Group     BP 05/29/24 2148 126/66     Girls Systolic BP Percentile --      Girls Diastolic BP Percentile --      Boys Systolic BP Percentile --      Boys Diastolic BP Percentile --      Pulse Rate 05/29/24 2148 74     Resp 05/29/24 2148 18     Temp 05/29/24 2148 98.9 F (37.2 C)     Temp src --      SpO2 05/29/24 2148 96 %     Weight 05/29/24 2149 209 lb 7 oz (95 kg)     Height 05/29/24 2149 5' 5 (1.651 m)     Head Circumference --      Peak Flow --      Pain Score 05/29/24 2149 5     Pain Loc --      Pain Education --      Exclude from Growth Chart --     Most recent vital signs: Vitals:   05/29/24 2148  BP: 126/66  Pulse: 74  Resp: 18  Temp: 98.9 F (37.2 C)  SpO2: 96%    Constitutional: Alert and oriented. Eyes: Conjunctivae are normal. Head: Atraumatic. Nose: No congestion/rhinnorhea. Mouth/Throat: Mucous membranes are moist.  Cardiovascular: Normal rate, regular rhythm. Grossly normal heart sounds.  2+ radial pulses bilaterally. Respiratory:  Normal respiratory effort.  No retractions. Lungs CTAB. Gastrointestinal: Soft and nontender. No distention. Musculoskeletal: No lower extremity tenderness nor edema.  Neurologic:  Normal speech and language. No gross focal neurologic deficits are appreciated.    ED Results / Procedures / Treatments   Labs (all labs ordered are listed, but only abnormal results are displayed) Labs Reviewed  CBC WITH DIFFERENTIAL/PLATELET  BASIC METABOLIC PANEL WITH GFR  HCG, QUANTITATIVE, PREGNANCY  URINALYSIS, ROUTINE W REFLEX MICROSCOPIC  POC URINE PREG, ED  ABO/RH     PROCEDURES:  Critical Care performed: No  Procedures   MEDICATIONS ORDERED IN ED: Medications  acetaminophen  (TYLENOL ) tablet 1,000 mg (1,000 mg Oral Given 05/29/24 2236)  ondansetron  (ZOFRAN -ODT) disintegrating tablet 4 mg (4 mg Oral Given 05/29/24 2236)     IMPRESSION / MDM / ASSESSMENT AND PLAN / ED COURSE  I reviewed the triage vital signs and the nursing notes.                              24 y.o. female G1P0 at  approximately 7 weeks of pregnancy with no significant past medical history who presents to the ED complaining of abdominal pain and light vaginal bleeding since yesterday.  Patient's presentation is most consistent with acute presentation with potential threat to life or bodily function.  Differential diagnosis includes, but is not limited to, ectopic pregnancy, threatened miscarriage, completed miscarriage, UTI, diverticulitis.  Patient nontoxic-appearing and in no acute distress, vital signs are unremarkable.  Her abdomen is soft without focal tenderness but presentation concerning for ectopic pregnancy and will further assess with ultrasound.  Lab results including hCG level and ABO/Rh are pending.   Patient turned over to oncoming provider pending labs and imaging results.      FINAL CLINICAL IMPRESSION(S) / ED DIAGNOSES   Final diagnoses:  Vaginal bleeding in pregnancy     Rx / DC Orders    ED Discharge Orders     None        Note:  This document was prepared using Dragon voice recognition software and may include unintentional dictation errors.   Willo Dunnings, MD 05/29/24 2247  "

## 2024-05-29 NOTE — ED Provider Notes (Signed)
----------------------------------------- °  11:10 PM on 05/29/2024 -----------------------------------------  Blood pressure 126/66, pulse 74, temperature 98.9 F (37.2 C), resp. rate 18, height 1.651 m (5' 5), weight 95 kg, last menstrual period 04/13/2024, SpO2 96%.   Assuming care from Dr. Willo.  In short, Teresa Mcmillan is a 24 y.o. female with a chief complaint of early pregnancy vaginal spotting/bleeding.  Refer to the original H&P for additional details.  The current plan of care is to follow-up on lab work and ultrasound and reassess.   Clinical Course as of 05/30/24 0338  Fri May 30, 2024  0035 ABO/RH(D): A POS Performed at [facility], [address] [place], [place] [address]  [CF]  [phone] Normal BMP and CBC [CF]  0328 US  OB LESS THAN 14 WEEKS WITH OB TRANSVAGINAL I independently viewed and interpreted the patient's ultrasound and the results are reassuring with a single intrauterine pregnancy.  The radiologist identified a small subchorionic hemorrhage and a fetus at 5 weeks and 6 days gestation. [CF]  [phone] I updated the patient and her partner and they will follow-up as planned as an outpatient; she has plans for an elective abortion next week.  I told her if she changes her mind about it she should establish prenatal care at the next available opportunity.  I gave my usual and customary return precautions. [CF]    Clinical Course User Index [CF] Gordan Huxley, MD     Medications  acetaminophen  (TYLENOL ) tablet 1,000 mg (1,000 mg Oral Given 05/29/24 2236)  ondansetron  (ZOFRAN -ODT) disintegrating tablet 4 mg (4 mg Oral Given 05/29/24 2236)     ED Discharge Orders     None      Final diagnoses:  Vaginal bleeding in pregnancy  Subchorionic hematoma in first trimester, single or unspecified fetus     Gordan Huxley, MD 05/30/24 [phone]

## 2024-05-29 NOTE — ED Triage Notes (Addendum)
 Patient reports she is about [redacted] weeks pregnant and reporting she noticed vaginal spotting- dark brown in color/reddish tint along with intermittent cramping on the left side of her abdominal area. This has been ongoing for 1 day. She also reports she has started to feel nauseated one week ago. Reports she has not established OB care yet. LMP 04/11/24.

## 2024-05-30 ENCOUNTER — Emergency Department

## 2024-05-30 LAB — BASIC METABOLIC PANEL WITH GFR
Anion gap: 9 (ref 5–15)
BUN: 10 mg/dL (ref 6–20)
CO2: 26 mmol/L (ref 22–32)
Calcium: 9.6 mg/dL (ref 8.9–10.3)
Chloride: 102 mmol/L (ref 98–111)
Creatinine, Ser: 0.63 mg/dL (ref 0.44–1.00)
GFR, Estimated: >60 mL/min
Glucose, Bld: 113 mg/dL — ABNORMAL HIGH (ref 70–99)
Potassium: 4.1 mmol/L (ref 3.5–5.1)
Sodium: 137 mmol/L (ref 135–145)

## 2024-05-30 LAB — ABO/RH: ABO/RH(D): A POS

## 2024-05-30 LAB — HCG, QUANTITATIVE, PREGNANCY: hCG, Beta Chain, Quant, S: 41140 m[IU]/mL — ABNORMAL HIGH

## 2024-05-30 NOTE — Discharge Instructions (Addendum)
 You have been seen in the Emergency Department (ED) for vaginal bleeding during pregnancy, which is called a threatened abortion.  Your evaluation was reassuring, and the ultrasound showed a normal pregnancy at 5 weeks and 6 days gestation. Please follow up as recommended above.  If you develop any other symptoms that concern you (including, but not limited to, persistent vomiting, worsening bleeding, abdominal or pelvic pain, or fever greater than 101), please return immediately to the Emergency Department.

## 2024-06-12 ENCOUNTER — Ambulatory Visit: Admission: RE | Admit: 2024-06-12 | Discharge: 2024-06-12 | Disposition: A | Discharge status: Home / Self Care

## 2024-06-12 ENCOUNTER — Ambulatory Visit: Admitting: Internal Medicine

## 2024-06-12 ENCOUNTER — Encounter: Payer: Self-pay | Admitting: Internal Medicine

## 2024-06-12 ENCOUNTER — Ambulatory Visit: Admission: RE | Admit: 2024-06-12 | Discharge: 2024-06-12 | Disposition: A | Source: Ambulatory Visit

## 2024-06-12 VITALS — BP 120/86 | HR 82 | Ht 65.0 in | Wt 217.6 lb

## 2024-06-12 DIAGNOSIS — K5904 Chronic idiopathic constipation: Secondary | ICD-10-CM

## 2024-06-12 DIAGNOSIS — Z6836 Body mass index (BMI) 36.0-36.9, adult: Secondary | ICD-10-CM | POA: Diagnosis not present

## 2024-06-12 DIAGNOSIS — E66812 Obesity, class 2: Secondary | ICD-10-CM

## 2024-06-12 DIAGNOSIS — Z0130 Encounter for examination of blood pressure without abnormal findings: Secondary | ICD-10-CM

## 2024-06-12 MED ORDER — GLYCERIN (ADULT) 2 G RE SUPP: RECTAL | 0 refills | Status: AC

## 2024-06-12 NOTE — Progress Notes (Signed)
 "  Established Patient Office Visit  Subjective:  Patient ID: Teresa Mcmillan, female    DOB: 01-20-2001  Age: 24 y.o. MRN: [id number]  Chief Complaint  Patient presents with   Follow-up    3 week follow up    Patient is here today for her follow up.  She has been feeling fairly well since last appointment.   She does have additional concerns to discuss today.  [facility] Patient is here today for FU for her chronic constipation. She feels like her bowels are backed up. She reports still having bowel movements and her normal routine is once every couple of days. She reports using stool softeners and miralax regularly. She used OTC suppository as needed. This has been a chronic problem over the last 6-8 years since she was in high school.  She reports vaping on occasion, denies frequent alcohol use. She reports on occasion her stools look slimy. She denies any blood or diarrhea. Will order KUB. Discussed starting Linzess or GI referral. She reports being on her menstrual cycle at this time due to recent abortion. She drinks at least 80 oz water a day. KUB showed moderate stool burden. Will start once daily glycerin  suppository for 5 days and then switch to Linzess when she returns to prevent constipation.  Labs are not due today.  She does not need refills.   I have reviewed her active problem list, medication list, allergies, family history, social history, health maintenance, notes from last encounter, lab results for her appointment today.      No other concerns at this time.   Past Medical History:  Diagnosis Date   Asthma     Past Surgical History:  Procedure Laterality Date   bands     Mom reports constrictive bands on her legs and finger when she was 18 months.   club feet      Social History   Socioeconomic History   Marital status: Single    Spouse name: Not on file   Number of children: Not on file   Years of  education: Not on file   Highest education level: Not on file  Occupational History   Not on file  Tobacco Use   Smoking status: Never   Smokeless tobacco: Never  Vaping Use   Vaping status: Never Used  Substance and Sexual Activity   Alcohol use: Yes    Alcohol/week: 1.0 standard drink of alcohol    Types: 1 Glasses of wine per week    Comment: occassionally   Drug use: No   Sexual activity: Yes    Partners: Male    Birth control/protection: Condom  Other Topics Concern   Not on file  Social History Narrative   Patient lives at home with mom and granmother. She is in the 9th grade at [employer]. She does well in school. She enjoys math, volleyball and swimming   Social Drivers of Health   Tobacco Use: Low Risk (06/12/2024)   Patient History    Smoking Tobacco Use: Never    Smokeless Tobacco Use: Never    Passive Exposure: Not on file  Financial Resource Strain: Low Risk  (12/11/2021)   Received from [facility]   Overall Financial Resource Strain (CARDIA)    Difficulty of Paying Living Expenses: Not hard at all  Food Insecurity: No Food Insecurity (12/11/2021)   Received from [facility]   Epic    Within  the past 12 months, you worried that your food would run out before you got the money to buy more.: Never true    Within the past 12 months, the food you bought just didn't last and you didn't have money to get more.: Never true  Transportation Needs: No Transportation Needs (12/11/2021)   Received from [facility] - Transportation    In the past 12 months, has lack of transportation kept you from medical appointments or from getting medications?: No    Lack of Transportation (Non-Medical): No  Physical Activity: Not on file  Stress: Not on file  Social Connections: Not on file  Intimate Partner Violence: Not At Risk (11/22/2023)   Epic    Fear of Current or Ex-Partner: No    Emotionally Abused: No     Physically Abused: No    Sexually Abused: No  Depression (PHQ2-9): Low Risk (11/22/2023)   Depression (PHQ2-9)    PHQ-2 Score: 0  Alcohol Screen: Not on file  Housing: Not on file  Utilities: Not on file  Health Literacy: Not on file    Family History  Problem Relation Age of Onset   Migraines Mother    ADD / ADHD Mother    Hypertension Father    Kidney disease Father    Hypertension Maternal Grandfather    Lung cancer Maternal Grandfather    Diabetes Paternal Grandmother    Breast cancer Maternal Aunt        unsure of age   Seizures Neg Hx    Autism Neg Hx    Anxiety disorder Neg Hx    Depression Neg Hx    Bipolar disorder Neg Hx    Schizophrenia Neg Hx     Allergies[1]  ROS     Objective:   BP 120/86   Pulse 82   Ht 5' 5 (1.651 m)   Wt 217 lb 9.6 oz (98.7 kg)   LMP 04/13/2024 (Approximate)   SpO2 94%   BMI 36.21 kg/m   Vitals:   06/12/24 0922  BP: 120/86  Pulse: 82  Height: 5' 5 (1.651 m)  Weight: 217 lb 9.6 oz (98.7 kg)  SpO2: 94%  BMI (Calculated): 36.21    Physical Exam   No results found for any visits on 06/12/24.  Recent Results (from the past 2160 hours)  POCT urine pregnancy     Status: Abnormal   Collection Time: 05/16/24  8:29 PM  Result Value Ref Range   Preg Test, Ur Positive (A) Negative  CBC with Differential     Status: None   Collection Time: 05/29/24 11:10 PM  Result Value Ref Range   WBC 5.9 4.0 - 10.5 K/uL   RBC 4.61 3.87 - 5.11 MIL/uL   Hemoglobin 12.7 12.0 - 15.0 g/dL   HCT 59.7 63.9 - 53.9 %   MCV 87.2 80.0 - 100.0 fL   MCH 27.5 26.0 - 34.0 pg   MCHC 31.6 30.0 - 36.0 g/dL   RDW 86.2 88.4 - 84.4 %   Platelets 256 150 - 400 K/uL   nRBC 0.0 0.0 - 0.2 %   Neutrophils Relative % 50 %   Neutro Abs 3.0 1.7 - 7.7 K/uL   Lymphocytes Relative 38 %   Lymphs Abs 2.2 0.7 - 4.0 K/uL   Monocytes Relative 7 %   Monocytes Absolute 0.4 0.1 - 1.0 K/uL   Eosinophils Relative 4 %   Eosinophils Absolute 0.2 0.0 - 0.5 K/uL  Basophils Relative 1 %   Basophils Absolute 0.0 0.0 - 0.1 K/uL   Immature Granulocytes 0 %   Abs Immature Granulocytes 0.02 0.00 - 0.07 K/uL    Comment: Performed at [facility], [address] [place], [place] [address]  Basic metabolic panel     Status: Abnormal   Collection Time: 05/29/24 11:10 PM  Result Value Ref Range   Sodium 137 135 - 145 mmol/L   Potassium 4.1 3.5 - 5.1 mmol/L   Chloride 102 98 - 111 mmol/L   CO2 26 22 - 32 mmol/L   Glucose, Bld 113 (H) 70 - 99 mg/dL    Comment: Glucose reference range applies only to samples taken after fasting for at least 8 hours.   BUN 10 6 - 20 mg/dL   Creatinine, Ser 9.36 0.44 - 1.00 mg/dL   Calcium 9.6 8.9 - 89.6 mg/dL   GFR, Estimated >39 >39 mL/min    Comment: (NOTE) Calculated using the CKD-EPI Creatinine Equation (2021)    Anion gap 9 5 - 15    Comment: Performed at [facility], [address] [place], [place] [address]  hCG, quantitative, pregnancy     Status: Abnormal   Collection Time: 05/29/24 11:10 PM  Result Value Ref Range   hCG, Beta Chain, Quant, S 41,140 (H) <5 mIU/mL    Comment:          GEST. AGE      CONC.  (mIU/mL)   <=1 WEEK        5 - 50     2 WEEKS       50 - 500     3 WEEKS       100 - 10,000     4 WEEKS     1,000 - 30,000     5 WEEKS     3,500 - 115,000   6-8 WEEKS     12,000 - 270,000    12 WEEKS     15,000 - 220,000        FEMALE AND NON-PREGNANT FEMALE:     LESS THAN 5 mIU/mL Performed at [facility], [address] [place], [place] [address]   ABO/Rh     Status: None   Collection Time: 05/29/24 11:10 PM  Result Value Ref Range   ABO/RH(D)      A POS Performed at [facility], [address], [place], [place] [address]        Assessment & Plan:   Assessment & Plan Chronic idiopathic constipation Class 2 severe obesity due to excess calories with serious comorbidity and body mass index (BMI) of 36.0 to 36.9 in adult - KUB showed moderate stool  burden - Reinforced healthy diet and exercise as tolerated. - Reinforced drinking plenty of water daily at least 80 oz per day. - Daily fiber intake of 20-30 grams per day. - Start Glycerin  Suppository once daily for 5 days.    Return in about 1 week (around 06/19/2024).   Total time spent: 25 minutes  Oddis DELENA Cain, FNP  06/12/2024   This document may have been prepared by [facility] Voice Recognition software and as such may include unintentional dictation errors.     [1]  Allergies Allergen Reactions   Amoxicillin Hives   Penicillins    Shellfish Allergy    "

## 2024-06-12 NOTE — Assessment & Plan Note (Signed)
-   KUB showed moderate stool burden - Reinforced healthy diet and exercise as tolerated. - Reinforced drinking plenty of water daily at least 80 oz per day. - Daily fiber intake of 20-30 grams per day. - Start Glycerin  Suppository once daily for 5 days.

## 2024-06-20 ENCOUNTER — Ambulatory Visit

## 2024-06-20 VITALS — BP 120/80 | HR 102 | Ht 65.0 in | Wt 210.0 lb

## 2024-06-20 DIAGNOSIS — E66811 Obesity, class 1: Secondary | ICD-10-CM

## 2024-06-20 DIAGNOSIS — K5904 Chronic idiopathic constipation: Secondary | ICD-10-CM

## 2024-06-20 DIAGNOSIS — Z0100 Encounter for examination of eyes and vision without abnormal findings: Secondary | ICD-10-CM | POA: Diagnosis not present

## 2024-06-20 DIAGNOSIS — E6609 Other obesity due to excess calories: Secondary | ICD-10-CM | POA: Diagnosis not present

## 2024-06-20 DIAGNOSIS — Z6834 Body mass index (BMI) 34.0-34.9, adult: Secondary | ICD-10-CM

## 2024-06-20 DIAGNOSIS — Z0130 Encounter for examination of blood pressure without abnormal findings: Secondary | ICD-10-CM

## 2024-06-20 MED ORDER — LINACLOTIDE 72 MCG PO CAPS: 72.0000 ug | ORAL_CAPSULE | Freq: Every day | ORAL | 3 refills | Status: AC

## 2024-06-20 NOTE — Progress Notes (Signed)
 "  Established Patient Office Visit  Subjective:  Patient ID: Teresa Mcmillan, female    DOB: 2000/12/28  Age: 24 y.o. MRN: [id number]  Chief Complaint  Patient presents with   Follow-up    1 week follow up    Patient is here today for her follow up.  Teresa Mcmillan has been feeling fairly well since last appointment.   Teresa Mcmillan does have additional concerns to discuss today. Teresa Mcmillan reports Teresa Mcmillan did not know that we sent glycerin  suppositories after her alt visit. Teresa Mcmillan denies any abdominal pain or cramping. Advised patient to start glycerin  suppositories and increase her fiber and water intake. Reinforced need for healthy diet and exercise as tolerated to promote weight loss. Will start Linzess 1 week later. Discussed abdominal xray showed moderate stool burden and that is contributing to her abdominal symptoms.  Labs are not due today.  Teresa Mcmillan does not need refills.   Patient would like referral to eye dr to have her eye exam completed. Teresa Mcmillan states Teresa Mcmillan has never had an eye exam. Teresa Mcmillan denies changes in her vision, eye pain, blurry vision or seeing spots. Teresa Mcmillan reports at night having difficulty seeing where Teresa Mcmillan is going when other vehicles are around because of the bright lights.  I have reviewed her active problem list, medication list, allergies, family history, social history, health maintenance, notes from last encounter, lab results for her appointment today.      No other concerns at this time.   Past Medical History:  Diagnosis Date   Asthma     Past Surgical History:  Procedure Laterality Date   bands     Mom reports constrictive bands on her legs and finger when Teresa Mcmillan was 18 months.   club feet      Social History   Socioeconomic History   Marital status: Single    Spouse name: Not on file   Number of children: Not on file   Years of education: Not on file   Highest education level: Not on file  Occupational History   Not on file  Tobacco Use   Smoking status: Never   Smokeless tobacco:  Never  Vaping Use   Vaping status: Never Used  Substance and Sexual Activity   Alcohol use: Yes    Alcohol/week: 1.0 standard drink of alcohol    Types: 1 Glasses of wine per week    Comment: occassionally   Drug use: No   Sexual activity: Yes    Partners: Male    Birth control/protection: Condom  Other Topics Concern   Not on file  Social History Narrative   Patient lives at home with mom and granmother. Teresa Mcmillan is in the 9th grade at [employer]. Teresa Mcmillan does well in school. Teresa Mcmillan enjoys math, volleyball and swimming   Social Drivers of Health   Tobacco Use: Low Risk (06/20/2024)   Patient History    Smoking Tobacco Use: Never    Smokeless Tobacco Use: Never    Passive Exposure: Not on file  Financial Resource Strain: Low Risk  (12/11/2021)   Received from [facility]   Overall Financial Resource Strain (CARDIA)    Difficulty of Paying Living Expenses: Not hard at all  Food Insecurity: No Food Insecurity (12/11/2021)   Received from [facility]   Epic    Within the past 12 months, you worried that your food would run out before you got the money to buy more.: Never true    Within the past 12  months, the food you bought just didn't last and you didn't have money to get more.: Never true  Transportation Needs: No Transportation Needs (12/11/2021)   Received from [facility] - Transportation    In the past 12 months, has lack of transportation kept you from medical appointments or from getting medications?: No    Lack of Transportation (Non-Medical): No  Physical Activity: Not on file  Stress: Not on file  Social Connections: Not on file  Intimate Partner Violence: Not At Risk (11/22/2023)   Epic    Fear of Current or Ex-Partner: No    Emotionally Abused: No    Physically Abused: No    Sexually Abused: No  Depression (PHQ2-9): Low Risk (11/22/2023)   Depression (PHQ2-9)    PHQ-2 Score: 0  Alcohol Screen: Not on file   Housing: Not on file  Utilities: Not on file  Health Literacy: Not on file    Family History  Problem Relation Age of Onset   Migraines Mother    ADD / ADHD Mother    Hypertension Father    Kidney disease Father    Hypertension Maternal Grandfather    Lung cancer Maternal Grandfather    Diabetes Paternal Grandmother    Breast cancer Maternal Aunt        unsure of age   Seizures Neg Hx    Autism Neg Hx    Anxiety disorder Neg Hx    Depression Neg Hx    Bipolar disorder Neg Hx    Schizophrenia Neg Hx     Allergies[1]  Review of Systems  Constitutional:  Negative for malaise/fatigue.  HENT: Negative.    Eyes:  Negative for blurred vision and pain.       Difficulty driving at night due to bright lights  Respiratory:  Negative for cough and shortness of breath.   Cardiovascular:  Negative for chest pain, palpitations, claudication and leg swelling.  Gastrointestinal:  Negative for abdominal pain, blood in stool, constipation, diarrhea, nausea and vomiting.       Abdominal bloating  Genitourinary:  Negative for dysuria, frequency and urgency.  Musculoskeletal: Negative.   Skin: Negative.   Neurological:  Negative for dizziness, tingling, sensory change and headaches.  Endo/Heme/Allergies: Negative.   Psychiatric/Behavioral: Negative.         Objective:   BP 120/80   Pulse (!) 102   Ht 5' 5 (1.651 m)   Wt 210 lb (95.3 kg)   LMP 06/12/2024 (Approximate)   SpO2 94%   BMI 34.95 kg/m   Vitals:   06/20/24 0900  BP: 120/80  Pulse: (!) 102  Height: 5' 5 (1.651 m)  Weight: 210 lb (95.3 kg)  SpO2: 94%  BMI (Calculated): 34.95    Physical Exam Vitals and nursing note reviewed.  Constitutional:      Appearance: Normal appearance.  HENT:     Head: Normocephalic.  Eyes:     Extraocular Movements: Extraocular movements intact.     Pupils: Pupils are equal, round, and reactive to light.  Cardiovascular:     Rate and Rhythm: Normal rate and regular rhythm.      Pulses: Normal pulses.     Heart sounds: Normal heart sounds. No murmur heard. Pulmonary:     Effort: Pulmonary effort is normal. No respiratory distress.     Breath sounds: Normal breath sounds.  Abdominal:     General: There is no distension.     Tenderness: There is no abdominal tenderness.  Musculoskeletal:        General: No tenderness. Normal range of motion.     Cervical back: Normal range of motion and neck supple.     Right lower leg: No edema.     Left lower leg: No edema.  Skin:    General: Skin is warm and dry.     Coloration: Skin is not jaundiced.     Findings: No erythema.  Neurological:     General: No focal deficit present.     Mental Status: Teresa Mcmillan is alert and oriented to person, place, and time.  Psychiatric:        Mood and Affect: Mood normal.        Speech: Speech normal.        Behavior: Behavior is cooperative.        Cognition and Memory: Memory is not impaired.      No results found for any visits on 06/20/24.  Recent Results (from the past 2160 hours)  POCT urine pregnancy     Status: Abnormal   Collection Time: 05/16/24  8:29 PM  Result Value Ref Range   Preg Test, Ur Positive (A) Negative  CBC with Differential     Status: None   Collection Time: 05/29/24 11:10 PM  Result Value Ref Range   WBC 5.9 4.0 - 10.5 K/uL   RBC 4.61 3.87 - 5.11 MIL/uL   Hemoglobin 12.7 12.0 - 15.0 g/dL   HCT 59.7 63.9 - 53.9 %   MCV 87.2 80.0 - 100.0 fL   MCH 27.5 26.0 - 34.0 pg   MCHC 31.6 30.0 - 36.0 g/dL   RDW 86.2 88.4 - 84.4 %   Platelets 256 150 - 400 K/uL   nRBC 0.0 0.0 - 0.2 %   Neutrophils Relative % 50 %   Neutro Abs 3.0 1.7 - 7.7 K/uL   Lymphocytes Relative 38 %   Lymphs Abs 2.2 0.7 - 4.0 K/uL   Monocytes Relative 7 %   Monocytes Absolute 0.4 0.1 - 1.0 K/uL   Eosinophils Relative 4 %   Eosinophils Absolute 0.2 0.0 - 0.5 K/uL   Basophils Relative 1 %   Basophils Absolute 0.0 0.0 - 0.1 K/uL   Immature Granulocytes 0 %   Abs Immature Granulocytes  0.02 0.00 - 0.07 K/uL    Comment: Performed at [facility], [address] [place], [place] [address]  Basic metabolic panel     Status: Abnormal   Collection Time: 05/29/24 11:10 PM  Result Value Ref Range   Sodium 137 135 - 145 mmol/L   Potassium 4.1 3.5 - 5.1 mmol/L   Chloride 102 98 - 111 mmol/L   CO2 26 22 - 32 mmol/L   Glucose, Bld 113 (H) 70 - 99 mg/dL    Comment: Glucose reference range applies only to samples taken after fasting for at least 8 hours.   BUN 10 6 - 20 mg/dL   Creatinine, Ser 9.36 0.44 - 1.00 mg/dL   Calcium 9.6 8.9 - 89.6 mg/dL   GFR, Estimated >39 >39 mL/min    Comment: (NOTE) Calculated using the CKD-EPI Creatinine Equation (2021)    Anion gap 9 5 - 15    Comment: Performed at [facility], [address] [place], [place] [address]  hCG, quantitative, pregnancy     Status: Abnormal   Collection Time: 05/29/24 11:10 PM  Result Value Ref Range   hCG, Beta Chain, Quant, S 41,140 (H) <5 mIU/mL  Comment:          GEST. AGE      CONC.  (mIU/mL)   <=1 WEEK        5 - 50     2 WEEKS       50 - 500     3 WEEKS       100 - 10,000     4 WEEKS     1,000 - 30,000     5 WEEKS     3,500 - 115,000   6-8 WEEKS     12,000 - 270,000    12 WEEKS     15,000 - 220,000        FEMALE AND NON-PREGNANT FEMALE:     LESS THAN 5 mIU/mL Performed at [facility], [address] [place], [place] [address]   ABO/Rh     Status: None   Collection Time: 05/29/24 11:10 PM  Result Value Ref Range   ABO/RH(D)      A POS Performed at [facility], [address], [place], [place] [address]        Assessment & Plan:   Assessment & Plan Chronic idiopathic constipation Class 1 obesity due to excess calories without serious comorbidity with body mass index (BMI) of 34.0 to 34.9 in adult - Start glycerin  chips as prescribed. - Wait 1 week and then start Linzess as prescribed. - Healthy diet, high fiber, increase water intake, exercise  as tolerated. - FU if symptoms don't improve or worsen. - Discussed future GI consult as needed. Routine eye exam - ophthalmology referral sent for routine eye exam.    Return in about 6 weeks (around 08/01/2024).   Total time spent: 20 minutes  Oddis DELENA Cain, FNP  06/20/2024   This document may have been prepared by [facility] Voice Recognition software and as such may include unintentional dictation errors.     [1]  Allergies Allergen Reactions   Amoxicillin Hives   Penicillins    Shellfish Allergy    "

## 2024-06-20 NOTE — Assessment & Plan Note (Signed)
-   Start glycerin  chips as prescribed. - Wait 1 week and then start Linzess as prescribed. - Healthy diet, high fiber, increase water intake, exercise as tolerated. - FU if symptoms don't improve or worsen. - Discussed future GI consult as needed.

## 2024-06-20 NOTE — Assessment & Plan Note (Signed)
-   ophthalmology referral sent for routine eye exam.

## 2024-08-01 ENCOUNTER — Ambulatory Visit: Admitting: Internal Medicine
